# Patient Record
Sex: Female | Born: 1970 | Race: Black or African American | Hispanic: No | Marital: Single | Smoking: Never smoker
Health system: Southern US, Community
[De-identification: ages and names within clinical notes are randomized; demographics above are authoritative.]

## PROBLEM LIST (undated history)

## (undated) DIAGNOSIS — R519 Headache, unspecified: Secondary | ICD-10-CM

## (undated) DIAGNOSIS — R51 Headache: Secondary | ICD-10-CM

## (undated) DIAGNOSIS — R569 Unspecified convulsions: Secondary | ICD-10-CM

## (undated) DIAGNOSIS — E7800 Pure hypercholesterolemia, unspecified: Secondary | ICD-10-CM

## (undated) DIAGNOSIS — E119 Type 2 diabetes mellitus without complications: Secondary | ICD-10-CM

## (undated) DIAGNOSIS — F419 Anxiety disorder, unspecified: Secondary | ICD-10-CM

## (undated) DIAGNOSIS — A749 Chlamydial infection, unspecified: Secondary | ICD-10-CM

## (undated) DIAGNOSIS — F329 Major depressive disorder, single episode, unspecified: Secondary | ICD-10-CM

## (undated) DIAGNOSIS — F32A Depression, unspecified: Secondary | ICD-10-CM

## (undated) DIAGNOSIS — J45909 Unspecified asthma, uncomplicated: Secondary | ICD-10-CM

## (undated) DIAGNOSIS — I639 Cerebral infarction, unspecified: Secondary | ICD-10-CM

## (undated) DIAGNOSIS — I10 Essential (primary) hypertension: Secondary | ICD-10-CM

## (undated) HISTORY — DX: Type 2 diabetes mellitus without complications: E11.9

## (undated) HISTORY — DX: Pure hypercholesterolemia, unspecified: E78.00

## (undated) HISTORY — DX: Unspecified convulsions: R56.9

## (undated) HISTORY — DX: Cerebral infarction, unspecified: I63.9

## (undated) HISTORY — DX: Anxiety disorder, unspecified: F41.9

## (undated) HISTORY — DX: Depression, unspecified: F32.A

## (undated) HISTORY — DX: Major depressive disorder, single episode, unspecified: F32.9

## (undated) HISTORY — DX: Headache, unspecified: R51.9

## (undated) HISTORY — DX: Chlamydial infection, unspecified: A74.9

## (undated) HISTORY — DX: Headache: R51

---

## 1992-09-02 HISTORY — PX: TUBAL LIGATION: SHX77

## 1998-05-29 ENCOUNTER — Emergency Department (HOSPITAL_COMMUNITY): Admission: EM | Admit: 1998-05-29 | Discharge: 1998-05-29 | Payer: Self-pay | Admitting: Emergency Medicine

## 1999-06-22 ENCOUNTER — Other Ambulatory Visit: Admission: RE | Admit: 1999-06-22 | Discharge: 1999-06-22 | Payer: Self-pay | Admitting: Family Medicine

## 1999-08-27 ENCOUNTER — Emergency Department (HOSPITAL_COMMUNITY): Admission: EM | Admit: 1999-08-27 | Discharge: 1999-08-27 | Payer: Self-pay | Admitting: *Deleted

## 1999-08-28 ENCOUNTER — Encounter: Payer: Self-pay | Admitting: *Deleted

## 1999-10-15 ENCOUNTER — Emergency Department (HOSPITAL_COMMUNITY): Admission: EM | Admit: 1999-10-15 | Discharge: 1999-10-16 | Payer: Self-pay | Admitting: Emergency Medicine

## 1999-10-16 ENCOUNTER — Encounter: Payer: Self-pay | Admitting: Emergency Medicine

## 1999-12-04 ENCOUNTER — Ambulatory Visit (HOSPITAL_COMMUNITY): Admission: RE | Admit: 1999-12-04 | Discharge: 1999-12-04 | Payer: Self-pay | Admitting: Occupational Medicine

## 1999-12-04 ENCOUNTER — Encounter: Payer: Self-pay | Admitting: Occupational Medicine

## 1999-12-21 ENCOUNTER — Emergency Department (HOSPITAL_COMMUNITY): Admission: EM | Admit: 1999-12-21 | Discharge: 1999-12-21 | Payer: Self-pay | Admitting: Emergency Medicine

## 1999-12-22 ENCOUNTER — Emergency Department (HOSPITAL_COMMUNITY): Admission: EM | Admit: 1999-12-22 | Discharge: 1999-12-22 | Payer: Self-pay

## 2000-06-04 ENCOUNTER — Emergency Department (HOSPITAL_COMMUNITY): Admission: EM | Admit: 2000-06-04 | Discharge: 2000-06-04 | Payer: Self-pay | Admitting: Emergency Medicine

## 2000-08-06 ENCOUNTER — Emergency Department (HOSPITAL_COMMUNITY): Admission: EM | Admit: 2000-08-06 | Discharge: 2000-08-06 | Payer: Self-pay | Admitting: Emergency Medicine

## 2000-08-14 ENCOUNTER — Ambulatory Visit (HOSPITAL_COMMUNITY): Admission: RE | Admit: 2000-08-14 | Discharge: 2000-08-14 | Payer: Self-pay | Admitting: Specialist

## 2000-08-14 ENCOUNTER — Encounter: Payer: Self-pay | Admitting: Specialist

## 2000-10-22 ENCOUNTER — Emergency Department (HOSPITAL_COMMUNITY): Admission: EM | Admit: 2000-10-22 | Discharge: 2000-10-22 | Payer: Self-pay | Admitting: *Deleted

## 2001-06-17 ENCOUNTER — Encounter: Payer: Self-pay | Admitting: Emergency Medicine

## 2001-06-17 ENCOUNTER — Emergency Department (HOSPITAL_COMMUNITY): Admission: EM | Admit: 2001-06-17 | Discharge: 2001-06-17 | Payer: Self-pay | Admitting: Emergency Medicine

## 2001-06-25 ENCOUNTER — Emergency Department (HOSPITAL_COMMUNITY): Admission: EM | Admit: 2001-06-25 | Discharge: 2001-06-26 | Payer: Self-pay | Admitting: Emergency Medicine

## 2001-06-25 ENCOUNTER — Encounter: Payer: Self-pay | Admitting: Emergency Medicine

## 2001-10-04 ENCOUNTER — Emergency Department (HOSPITAL_COMMUNITY): Admission: EM | Admit: 2001-10-04 | Discharge: 2001-10-04 | Payer: Self-pay | Admitting: *Deleted

## 2001-10-04 ENCOUNTER — Encounter: Payer: Self-pay | Admitting: *Deleted

## 2003-05-21 ENCOUNTER — Emergency Department (HOSPITAL_COMMUNITY): Admission: EM | Admit: 2003-05-21 | Discharge: 2003-05-21 | Payer: Self-pay | Admitting: Emergency Medicine

## 2003-06-03 ENCOUNTER — Other Ambulatory Visit: Admission: RE | Admit: 2003-06-03 | Discharge: 2003-06-03 | Payer: Self-pay | Admitting: Obstetrics and Gynecology

## 2003-09-12 ENCOUNTER — Emergency Department (HOSPITAL_COMMUNITY): Admission: EM | Admit: 2003-09-12 | Discharge: 2003-09-12 | Payer: Self-pay | Admitting: Emergency Medicine

## 2004-05-04 ENCOUNTER — Inpatient Hospital Stay (HOSPITAL_COMMUNITY): Admission: EM | Admit: 2004-05-04 | Discharge: 2004-05-06 | Payer: Self-pay | Admitting: Emergency Medicine

## 2004-06-12 ENCOUNTER — Emergency Department (HOSPITAL_COMMUNITY): Admission: EM | Admit: 2004-06-12 | Discharge: 2004-06-12 | Payer: Self-pay | Admitting: Emergency Medicine

## 2004-07-11 ENCOUNTER — Ambulatory Visit: Payer: Self-pay | Admitting: Psychology

## 2004-08-02 ENCOUNTER — Ambulatory Visit: Payer: Self-pay | Admitting: Psychology

## 2004-11-15 ENCOUNTER — Emergency Department (HOSPITAL_COMMUNITY): Admission: EM | Admit: 2004-11-15 | Discharge: 2004-11-15 | Payer: Self-pay | Admitting: Emergency Medicine

## 2005-09-20 ENCOUNTER — Emergency Department (HOSPITAL_COMMUNITY): Admission: EM | Admit: 2005-09-20 | Discharge: 2005-09-20 | Payer: Self-pay | Admitting: Emergency Medicine

## 2006-01-01 ENCOUNTER — Encounter: Payer: Self-pay | Admitting: Emergency Medicine

## 2006-10-14 ENCOUNTER — Emergency Department (HOSPITAL_COMMUNITY): Admission: EM | Admit: 2006-10-14 | Discharge: 2006-10-14 | Payer: Self-pay | Admitting: Emergency Medicine

## 2007-08-19 ENCOUNTER — Emergency Department (HOSPITAL_COMMUNITY): Admission: EM | Admit: 2007-08-19 | Discharge: 2007-08-19 | Payer: Self-pay | Admitting: Emergency Medicine

## 2007-11-17 ENCOUNTER — Emergency Department (HOSPITAL_COMMUNITY): Admission: EM | Admit: 2007-11-17 | Discharge: 2007-11-17 | Payer: Self-pay | Admitting: Emergency Medicine

## 2009-06-03 ENCOUNTER — Emergency Department: Payer: Self-pay | Admitting: Emergency Medicine

## 2009-12-13 ENCOUNTER — Emergency Department (HOSPITAL_COMMUNITY): Admission: EM | Admit: 2009-12-13 | Discharge: 2009-12-14 | Payer: Self-pay | Admitting: Emergency Medicine

## 2009-12-14 ENCOUNTER — Emergency Department (HOSPITAL_COMMUNITY): Admission: EM | Admit: 2009-12-14 | Discharge: 2009-12-14 | Payer: Self-pay | Admitting: Emergency Medicine

## 2010-11-21 LAB — URINALYSIS, ROUTINE W REFLEX MICROSCOPIC
Glucose, UA: NEGATIVE mg/dL
Hgb urine dipstick: NEGATIVE
Protein, ur: NEGATIVE mg/dL
Specific Gravity, Urine: 1.011 (ref 1.005–1.030)
Urobilinogen, UA: 0.2 mg/dL (ref 0.0–1.0)
pH: 6.5 (ref 5.0–8.0)

## 2011-05-27 LAB — DIFFERENTIAL
Basophils Relative: 0
Eosinophils Absolute: 0.1
Eosinophils Relative: 2
Lymphocytes Relative: 42
Lymphs Abs: 1.9
Neutro Abs: 2.4
Neutrophils Relative %: 51

## 2011-05-27 LAB — I-STAT 8, (EC8 V) (CONVERTED LAB)
Acid-base deficit: 3 — ABNORMAL HIGH
BUN: 9
Bicarbonate: 22.9
HCT: 43
Hemoglobin: 14.6
Sodium: 138
TCO2: 24

## 2011-05-27 LAB — POCT I-STAT CREATININE
Creatinine, Ser: 1
Operator id: 294521

## 2011-05-27 LAB — URINALYSIS, ROUTINE W REFLEX MICROSCOPIC
Bilirubin Urine: NEGATIVE
Hgb urine dipstick: NEGATIVE
Nitrite: NEGATIVE
Specific Gravity, Urine: 1.01
Urobilinogen, UA: 0.2

## 2011-05-27 LAB — CBC
MCV: 82.4
Platelets: 200
RDW: 13.2

## 2011-05-27 LAB — POCT CARDIAC MARKERS: Myoglobin, poc: 69.1

## 2011-10-16 ENCOUNTER — Encounter (HOSPITAL_COMMUNITY): Payer: Self-pay | Admitting: Emergency Medicine

## 2011-10-16 ENCOUNTER — Other Ambulatory Visit: Payer: Self-pay

## 2011-10-16 ENCOUNTER — Emergency Department (HOSPITAL_COMMUNITY)
Admission: EM | Admit: 2011-10-16 | Discharge: 2011-10-16 | Disposition: A | Discharge status: Home / Self Care | Payer: BC Managed Care – PPO | Attending: Emergency Medicine | Admitting: Emergency Medicine

## 2011-10-16 DIAGNOSIS — R209 Unspecified disturbances of skin sensation: Secondary | ICD-10-CM | POA: Insufficient documentation

## 2011-10-16 DIAGNOSIS — F410 Panic disorder [episodic paroxysmal anxiety] without agoraphobia: Secondary | ICD-10-CM

## 2011-10-16 DIAGNOSIS — R4589 Other symptoms and signs involving emotional state: Secondary | ICD-10-CM | POA: Insufficient documentation

## 2011-10-16 NOTE — ED Provider Notes (Signed)
9:41 PM  Date: 10/16/2011  Rate: 72  Rhythm: normal sinus rhythm  QRS Axis: left  Intervals: normal  ST/T Wave abnormalities: normal  Conduction Disutrbances:none  Narrative Interpretation: Essentially normal EKG.  Old EKG Reviewed: unchanged    Carleene Cooper III, MD 10/16/11 2142

## 2011-10-16 NOTE — ED Provider Notes (Signed)
History     CSN: [id number]  Arrival date & time 10/16/11  2037   First MD Initiated Contact with Patient 10/16/11 2215      Chief Complaint  Patient presents with  . Panic Attack    (Consider location/radiation/quality/duration/timing/severity/associated sxs/prior treatment) HPI  41 year old female with history of depression and panic attack is present the ED with chief complaints attack. Patient states she was at home were placed today and was having a conversation with her good friends. While in a conversation her friend asked about patient's daughter, whom she just recently had a verbal argument with. Patient believes talked about her daughter has caused her to have a panic attack. She fell heart palpitations, with tingling sensation throughout body especially around the lips. The symptoms are very similar to prior heart attack. Symptoms lasted for 5-10 minutes. At the urging of her fianc and her coworker she is here in the ED. Patient states the symptom has resolved she is back to her normal baseline. She denies homicidal or suicidal ideation. She denies fever, headache, chest pain, shortness of breath, nausea, vomiting, diarrhea, abdominal pain, back pain, dysuria. She denies any recent recreational drug use. Patient does have antianxiety medication at home, but she does not like to take it.  Patient states she does have a good family support system.  History reviewed. No pertinent past medical history.  History reviewed. No pertinent past surgical history.  No family history on file.  History  Substance Use Topics  . Smoking status: Never Smoker   . Smokeless tobacco: Not on file  . Alcohol Use:     OB History    Grav Para Term Preterm Abortions TAB SAB Ect Mult Living                  Review of Systems  All other systems reviewed and are negative.    Allergies  Review of patient's allergies indicates no known allergies.  Home Medications   Current Outpatient  Rx  Name Route Sig Dispense Refill  . ALKA-SELTZER ANTACID PO Oral Take 2 tablets by mouth daily as needed. Flu symptons    . PHENYLEPHRINE-DM-GG-APAP 5-10-200-325 MG PO TABS Oral Take 1 tablet by mouth daily as needed. Cold/flu symptons      BP 127/84  Pulse 72  Temp 98 F (36.7 C)  Resp 16  Wt 269 lb 13.5 oz (122.4 kg)  SpO2 99%  LMP 10/09/2011  Physical Exam  Nursing note and vitals reviewed. Constitutional: She appears well-developed and well-nourished. No distress.       Awake, alert, nontoxic appearance  HENT:  Head: Atraumatic.  Eyes: Conjunctivae are normal. Right eye exhibits no discharge. Left eye exhibits no discharge.  Neck: Neck supple.  Cardiovascular: Normal rate and regular rhythm.   Pulmonary/Chest: Effort normal. No respiratory distress. She exhibits no tenderness.  Abdominal: Soft. There is no tenderness. There is no rebound.  Musculoskeletal: She exhibits no tenderness.       ROM appears intact, no obvious focal weakness  Neurological:       Mental status and motor strength appears intact  Skin: No rash noted.  Psychiatric: She has a normal mood and affect.    ED Course  Procedures (including critical care time)   Labs Reviewed  GLUCOSE, CAPILLARY   No results found.   1. Panic attack as reaction to stress       MDM  Patient is back to her normal baseline. The symptom is likely another panic  attack. We have obtain EKG that shows no acute finding. Patient requests to be discharged. Patient agrees to follow up with her primary care Dr. for further evaluation. She is currently with normal stable vital signs.  Medical screening examination/treatment/procedure(s) were performed by non-physician practitioner and as supervising physician I was immediately available for consultation/collaboration. Osvaldo Human, M.D.      Fayrene Helper, PA-C 10/16/11 2230  Carleene Cooper III, MD 10/17/11 1048

## 2011-10-16 NOTE — ED Notes (Signed)
Pt alert, nad, presents to ED, c/o panic attack, onset today, states stressful event, instructed to come to ER, denies SI/HI, resp even unlabored, skin pwd

## 2011-10-16 NOTE — Discharge Instructions (Signed)
Anxiety and Panic Attacks Anxiety is your body's way of reacting to real danger or something you think is a danger. It may be fear or worry over a situation like losing your job. Sometimes the cause is not known. A panic attack is made up of physical signs like sweating, shaking, or chest pain. Anxiety and panic attacks may start suddenly. They may be strong. They may come at any time of day, even while sleeping. They may come at any time of life. Panic attacks are scary, but they do not harm you physically.  HOME CARE  Avoid any known causes of your anxiety.   Try to relax. Yoga may help. Tell yourself everything will be okay.   Exercise often.   Get expert advice and help (therapy) to stop anxiety or attacks from happening.   Avoid caffeine, alcohol, and drugs.   Only take medicine as told by your doctor.  GET HELP RIGHT AWAY IF:  Your attacks seem different than normal attacks.   Your problems are getting worse or concern you.  MAKE SURE YOU:  Understand these instructions.   Will watch your condition.   Will get help right away if you are not doing well or get worse.  Document Released: 09/21/2010 Document Revised: 05/01/2011 Document Reviewed: 09/21/2010 [facility] Patient Information 2012 [place], [place].  Stress Management Stress is a state of physical or mental tension that often results from changes in your life or normal routine. Some common causes of stress are:  Death of a loved one.   Injuries or severe illnesses.   Getting fired or changing jobs.   Moving into a new home.  Other causes may be:  Sexual problems.   Business or financial losses.   Taking on a large debt.   Regular conflict with someone at home or at work.   Constant tiredness from lack of sleep.  It is not just bad things that are stressful. It may be stressful to:  Win the lottery.   Get married.   Buy a new car.  The amount of stress that can be easily tolerated varies from person  to person. Changes generally cause stress, regardless of the types of change. Too much stress can affect your health. It may lead to physical or emotional problems. Too little stress (boredom) may also become stressful. SUGGESTIONS TO REDUCE STRESS:  Talk things over with your family and friends. It often is helpful to share your concerns and worries. If you feel your problem is serious, you may want to get help from a professional counselor.   Consider your problems one at a time instead of lumping them all together. Trying to take care of everything at once may seem impossible. List all the things you need to do and then start with the most important one. Set a goal to accomplish 2 or 3 things each day. If you expect to do too many in a single day you will naturally fail, causing you to feel even more stressed.   Do not use alcohol or drugs to relieve stress. Although you may feel better for a short time, they do not remove the problems that caused the stress. They can also be habit forming.   Exercise regularly - at least 3 times per week. Physical exercise can help to relieve that "uptight" feeling and will relax you.   The shortest distance between despair and hope is often a good night's sleep.   Go to bed and get up on time  allowing yourself time for appointments without being rushed.   Take a short "time-out" period from any stressful situation that occurs during the day. Close your eyes and take some deep breaths. Starting with the muscles in your face, tense them, hold it for a few seconds, then relax. Repeat this with the muscles in your neck, shoulders, hand, stomach, back and legs.   Take good care of yourself. Eat a balanced diet and get plenty of rest.   Schedule time for having fun. Take a break from your daily routine to relax.  HOME CARE INSTRUCTIONS   Call if you feel overwhelmed by your problems and feel you can no longer manage them on your own.   Return immediately if  you feel like hurting yourself or someone else.  Document Released: 02/12/2001 Document Revised: 05/01/2011 Document Reviewed: 10/05/2007 [facility] Patient Information 2012 [place], [place].

## 2012-09-02 DIAGNOSIS — I639 Cerebral infarction, unspecified: Secondary | ICD-10-CM

## 2012-09-02 HISTORY — DX: Cerebral infarction, unspecified: I63.9

## 2013-05-28 DIAGNOSIS — E785 Hyperlipidemia, unspecified: Secondary | ICD-10-CM | POA: Insufficient documentation

## 2013-05-31 ENCOUNTER — Other Ambulatory Visit: Payer: Self-pay | Admitting: Family Medicine

## 2013-05-31 DIAGNOSIS — Z1231 Encounter for screening mammogram for malignant neoplasm of breast: Secondary | ICD-10-CM

## 2014-01-17 DIAGNOSIS — I10 Essential (primary) hypertension: Secondary | ICD-10-CM | POA: Insufficient documentation

## 2014-06-01 DIAGNOSIS — I6782 Cerebral ischemia: Secondary | ICD-10-CM | POA: Insufficient documentation

## 2014-06-01 DIAGNOSIS — G939 Disorder of brain, unspecified: Secondary | ICD-10-CM | POA: Insufficient documentation

## 2014-11-10 DIAGNOSIS — E559 Vitamin D deficiency, unspecified: Secondary | ICD-10-CM | POA: Insufficient documentation

## 2015-06-16 DIAGNOSIS — F3341 Major depressive disorder, recurrent, in partial remission: Secondary | ICD-10-CM | POA: Insufficient documentation

## 2015-07-14 DIAGNOSIS — R7303 Prediabetes: Secondary | ICD-10-CM | POA: Insufficient documentation

## 2015-07-14 DIAGNOSIS — IMO0001 Reserved for inherently not codable concepts without codable children: Secondary | ICD-10-CM | POA: Insufficient documentation

## 2015-07-14 DIAGNOSIS — R198 Other specified symptoms and signs involving the digestive system and abdomen: Secondary | ICD-10-CM | POA: Insufficient documentation

## 2015-10-18 DIAGNOSIS — R946 Abnormal results of thyroid function studies: Secondary | ICD-10-CM | POA: Insufficient documentation

## 2016-02-14 ENCOUNTER — Emergency Department (HOSPITAL_COMMUNITY)
Admission: EM | Admit: 2016-02-14 | Discharge: 2016-02-14 | Disposition: A | Discharge status: Home / Self Care | Payer: BC Managed Care – PPO | Attending: Emergency Medicine | Admitting: Emergency Medicine

## 2016-02-14 ENCOUNTER — Emergency Department (HOSPITAL_COMMUNITY): Payer: BC Managed Care – PPO

## 2016-02-14 ENCOUNTER — Encounter (HOSPITAL_COMMUNITY): Payer: Self-pay | Admitting: Emergency Medicine

## 2016-02-14 DIAGNOSIS — R569 Unspecified convulsions: Secondary | ICD-10-CM | POA: Diagnosis present

## 2016-02-14 DIAGNOSIS — Z7982 Long term (current) use of aspirin: Secondary | ICD-10-CM | POA: Diagnosis not present

## 2016-02-14 DIAGNOSIS — I10 Essential (primary) hypertension: Secondary | ICD-10-CM | POA: Diagnosis not present

## 2016-02-14 DIAGNOSIS — Z79899 Other long term (current) drug therapy: Secondary | ICD-10-CM | POA: Diagnosis not present

## 2016-02-14 DIAGNOSIS — J45909 Unspecified asthma, uncomplicated: Secondary | ICD-10-CM | POA: Insufficient documentation

## 2016-02-14 HISTORY — DX: Unspecified asthma, uncomplicated: J45.909

## 2016-02-14 HISTORY — DX: Essential (primary) hypertension: I10

## 2016-02-14 LAB — CBC
HEMATOCRIT: 37.2 % (ref 36.0–46.0)
Hemoglobin: 12.6 g/dL (ref 12.0–15.0)
MCH: 26.4 pg (ref 26.0–34.0)
MCHC: 33.9 g/dL (ref 30.0–36.0)
MCV: 77.8 fL — AB (ref 78.0–100.0)
Platelets: 234 10*3/uL (ref 150–400)
RBC: 4.78 MIL/uL (ref 3.87–5.11)
RDW: 13.6 % (ref 11.5–15.5)
WBC: 8.2 10*3/uL (ref 4.0–10.5)

## 2016-02-14 LAB — I-STAT TROPONIN, ED: Troponin i, poc: 0.01 ng/mL (ref 0.00–0.08)

## 2016-02-14 LAB — BASIC METABOLIC PANEL
Anion gap: 8 (ref 5–15)
BUN: 10 mg/dL (ref 6–20)
CHLORIDE: 102 mmol/L (ref 101–111)
CO2: 26 mmol/L (ref 22–32)
Calcium: 8.8 mg/dL — ABNORMAL LOW (ref 8.9–10.3)
Creatinine, Ser: 0.72 mg/dL (ref 0.44–1.00)
GFR calc Af Amer: >60 mL/min (ref >60)
GFR calc non Af Amer: >60 mL/min (ref >60)
GLUCOSE: 108 mg/dL — AB (ref 65–99)
POTASSIUM: 3 mmol/L — AB (ref 3.5–5.1)
Sodium: 136 mmol/L (ref 135–145)

## 2016-02-14 MED ORDER — ACETAMINOPHEN 500 MG PO TABS
1000.0000 mg | ORAL_TABLET | Freq: Once | ORAL | Status: AC
  Administered 2016-02-14: 1000 mg via ORAL
  Filled 2016-02-14: qty 2

## 2016-02-14 NOTE — ED Notes (Signed)
Pt states that her fiance told her that last night, she was convulsing, and choking.  No hx of seizures.  States that she has been having headaches.  States that she bit her tongue in her sleep a few weeks  ago. And thinks she might have had a seizure then.

## 2016-02-14 NOTE — ED Notes (Signed)
Now complaining of chest pain radiating down rt arm.  Will get EKG.

## 2016-02-14 NOTE — ED Provider Notes (Signed)
CSN: [id number]     Arrival date & time 02/14/16  1159 History   First MD Initiated Contact with Patient 02/14/16 1424     Chief Complaint  Patient presents with  . Seizures     (Consider location/radiation/quality/duration/timing/severity/associated sxs/prior Treatment) Patient is a 45 y.o. female presenting with seizures. The history is provided by the patient.  Seizures Seizure activity on arrival: yes   Seizure type:  Grand mal Preceding symptoms comment:  Patient was asleep Initial focality:  None Episode characteristics: generalized shaking, tongue biting and unresponsiveness   Postictal symptoms: confusion, memory loss and somnolence   Return to baseline: yes   Severity:  Moderate Duration:  30 seconds Timing:  Once Number of seizures this episode:  1 Progression:  Unchanged Context: not alcohol withdrawal and not fever   Recent head injury:  No recent head injuries PTA treatment:  None History of seizures: no     Past Medical History  Diagnosis Date  . Asthma   . Hypertension    No past surgical history on file. No family history on file. Social History  Substance Use Topics  . Smoking status: Never Smoker   . Smokeless tobacco: None  . Alcohol Use: No   OB History    No data available     Review of Systems  Neurological: Positive for seizures.  All other systems reviewed and are negative.     Allergies  Review of patient's allergies indicates no known allergies.  Home Medications   Prior to Admission medications   Medication Sig Start Date End Date Taking? Authorizing Provider  Calcium Carbonate Antacid (ALKA-SELTZER ANTACID PO) Take 2 tablets by mouth daily as needed. Flu symptons    Historical Provider, MD  Phenylephrine-DM-GG-APAP (TYLENOL COLD/FLU SEVERE) 5-10-200-325 MG TABS Take 1 tablet by mouth daily as needed. Cold/flu symptons    Historical Provider, MD   BP 114/69 mmHg  Pulse 73  Temp(Src) 99 F (37.2 C) (Oral)  Resp 16  Ht 5'  5" (1.651 m)  Wt 262 lb (118.842 kg)  BMI 43.60 kg/m2  SpO2 93%  LMP 02/07/2016 Physical Exam  Constitutional: She is oriented to person, place, and time. She appears well-developed and well-nourished. No distress.  HENT:  Head: Normocephalic.  Eyes: Conjunctivae are normal.  Neck: Neck supple. No tracheal deviation present.  Cardiovascular: Normal rate, regular rhythm and normal heart sounds.   Pulmonary/Chest: Effort normal. No respiratory distress.  Abdominal: Soft. She exhibits no distension.  Neurological: She is alert and oriented to person, place, and time. She has normal strength. No cranial nerve deficit or sensory deficit. Coordination normal. GCS eye subscore is 4. GCS verbal subscore is 5. GCS motor subscore is 6.  Normal finger to nose testing and rapid alternating movement   Skin: Skin is warm and dry.  Psychiatric: She has a normal mood and affect.  Vitals reviewed.   ED Course  Procedures (including critical care time) Labs Review Labs Reviewed  BASIC METABOLIC PANEL - Abnormal; Notable for the following:    Potassium 3.0 (*)    Glucose, Bld 108 (*)    Calcium 8.8 (*)    All other components within normal limits  CBC - Abnormal; Notable for the following:    MCV 77.8 (*)    All other components within normal limits  Rosezena SensorI-STAT TROPOININ, ED    Imaging Review Dg Chest 2 View  02/14/2016  CLINICAL DATA:  Query nocturnal seizures. EXAM: CHEST  2 VIEW COMPARISON:  None. FINDINGS:  AP semi-erect radiograph demonstrates borderline cardiac enlargement. Low lung volumes accentuate the apparent heart size. No infiltrates or failure. No effusion or pneumothorax. No osseous findings. Mild degenerative scoliosis convex RIGHT. IMPRESSION: Borderline cardiac enlargement, stable from priors. No active infiltrates or failure. Electronically Signed   By: Elsie Stain M.D.   On: 02/14/2016 14:00   Ct Head Wo Contrast  02/14/2016  CLINICAL DATA:  Seizure 1 day prior.  Headache  following seizure EXAM: CT HEAD WITHOUT CONTRAST TECHNIQUE: Contiguous axial images were obtained from the base of the skull through the vertex without intravenous contrast. COMPARISON:  November 17, 2007 FINDINGS: The ventricles are normal in size and configuration. There is invagination of CSF into the sella, consistent with a degree of empty sella. There is no intracranial mass, hemorrhage, extra-axial fluid collection, or midline shift. Gray-white compartments are normal. No acute infarct evident. The bony calvarium appears intact. The mastoid air cells are clear. Orbits appear symmetric and unremarkable bilaterally. 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 The ventricles are normal in size and configuration. There is no intracranial mass, hemorrhage, or focal gray -white compartment lesion. Electronically Signed   By: Bretta Bang III M.D.   On: 02/14/2016 16:07   I have personally reviewed and evaluated these images and lab results as part of my medical decision-making.   EKG Interpretation   Date/Time:  Wednesday February 14 2016 13:27:42 EDT Ventricular Rate:  73 PR Interval:  142 QRS Duration: 86 QT Interval:  434 QTC Calculation: 478 R Axis:   -37 Text Interpretation:  Sinus rhythm Left axis deviation Abnormal R-wave  progression, late transition No significant change since last tracing  Confirmed by Peyton Spengler MD, Reuel Boom ([id number]) on 02/14/2016 2:25:46 PM      MDM   Final diagnoses:  Convulsions, unspecified convulsion type (HCC)   45 y.o. female presents with single possible seizure episode while she was in bed last night and she was told by s/o that she was shaking and convulsing but stopped and had a staring spell. Minor tongue and lip abrasions from possible event, not incontinent during episode. No recurrence. Never had a seizure before, unclear if this represents a seizure episode and was not witnessed by [employer]. Workup negative for emergent etiology and not at risk for  status. Return precautions discussed and routine OP f/u with neurology to establish care and complete workup.     Lyndal Pulley, MD 02/14/16 [id number]

## 2016-02-14 NOTE — Discharge Instructions (Signed)

## 2016-02-19 ENCOUNTER — Ambulatory Visit (INDEPENDENT_AMBULATORY_CARE_PROVIDER_SITE_OTHER): Payer: BC Managed Care – PPO | Admitting: Neurology

## 2016-02-19 ENCOUNTER — Encounter: Payer: Self-pay | Admitting: Neurology

## 2016-02-19 VITALS — BP 110/68 | HR 68 | Ht 65.0 in | Wt 279.2 lb

## 2016-02-19 DIAGNOSIS — R569 Unspecified convulsions: Secondary | ICD-10-CM | POA: Diagnosis not present

## 2016-02-19 NOTE — Patient Instructions (Addendum)
Remember to drink plenty of fluid, eat healthy meals and do not skip any meals. Try to eat protein with a every meal and eat a healthy snack such as fruit or nuts in between meals. Try to keep a regular sleep-wake schedule and try to exercise daily, particularly in the form of walking, 20-30 minutes a day, if you can.   As far as diagnostic testing: MRI brain, EEG in the office then if normal recommend a 2-3 day eeg at home  I would like to see you back in 3 months, sooner if we need to. Please call us with any interim questions, concerns, problems, updates or refill requests.   Our phone number is [phone]. We also have an after hours call service for urgent matters and there is a physician on-call for urgent questions. For any emergencies you know to call 911 or go to the nearest emergency room  Patient is unable to drive, operate heavy machinery, perform activities at heights or participate in water activities until 6 months seizure free

## 2016-02-19 NOTE — Progress Notes (Addendum)
[facility]    Provider:  Dr Lucia GaskinsAhern Referring Provider: emergency room Primary Care Physician:  Delbert HarnessBRISCOE, KIM, MD  CC: seizure  HPI:  Kirsten Huang is a 45 y.o. female here as a referral from the emergency room for seizure. She has a past medical history of asthma, hypertension, diabetes, anxiety, depression, high cholesterol. She woke up on the 14th of this month and her fiance was very worried, in her face, he mouth hurt very bad, he said she was convulsing, she bit her tongue in 4 different spots. Her tongue was cut open. She was drooling. She would not wake up, she was very tired. She remembers the emergency room. She denies any other associated symptoms. No Hx of seizures or FHx of seizures. She was attacked by a swarm of bees a week previous otherwise no inciting events or previous illnesses or head trauma. .A few months ago she woke up with a bitten tongue as well. Never had alteration of consciousness, or any other strange episodes in the past. Never urinated on herself. The episode lasted about 30 seconds. She denied any alcohol or drug use.denied any other focal neurologic deficits or symptoms.  Reviewed notes, labs and imaging from outside physicians, which showed:  CT of the head (personally reviewed and agree with the following): The ventricles are normal in size and configuration. There is invagination of CSF into the sella, consistent with a degree of empty sella. There is no intracranial mass, hemorrhage, extra-axial fluid collection, or midline shift. Gray-white compartments are normal. No acute infarct evident. The bony calvarium appears intact. The mastoid air cells are clear. Orbits appear symmetric and unremarkable bilaterally.  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 The ventricles are normal in size and configuration. There is no intracranial mass, hemorrhage, or focal gray -white compartment lesion.  BMP in the hospital was abnormal with  potassium of 3,CBC was essentially normal  Reviewed emergency room notes.Patient presented on the 14th of this month to the ED after fianc told her that she was convulsing and choking. Patient had been having headaches and her tongue was bitten that evening as well as a few weeks ago. She also complained of chest pain radiating down the right arm. EKG Was stable as compared since last tracing. They reported generalized shaking and tongue biting and unresponsiveness with postictal symptoms of confusion and memory loss and somnolence. The duration was 30 seconds. No alcohol. No recent head injuries.. CT of the head was completed.Vitals were normal. Exam in the emergency room was normal, no mention of her tongue was made in exam. She was diagnosed with a seizure. Also reported as staring spell afterwards. They did note minor tongue and lip abrasions not incontinent during episodes. Workup was negative and patient was discharged with outpatient neurology follow-up.  In 2013 patient reported to the emergency room complaining of a panic attack after a stressful event    Review of Systems: Patient complains of symptoms per HPI as well as the following symptoms: joint pain, allergies. Pertinent negatives per HPI. All others negative.   Social History   Social History  . Marital Status: Single    Spouse Name: N/A  . Number of Children: 4  . Years of Education: 16   Occupational History  . [employer]    Social History Main Topics  . Smoking status: Never Smoker   . Smokeless tobacco: Not on file  . Alcohol Use: No  . Drug Use: Not on file  .  Sexual Activity: Not on file   Other Topics Concern  . Not on file   Social History Narrative   Lives with boyfriend   Caffeine use: soda daily       Family History  Problem Relation Age of Onset  . Cancer    . Diabetes    . Seizures Neg Hx     Past Medical History  Diagnosis Date  . Asthma   . Hypertension   . Diabetes (HCC)   .  Anxiety   . Depression   . High cholesterol   . Stroke (cerebrum) (HCC) 2014  . Headache     Past Surgical History  Procedure Laterality Date  . Tympanostomy tube placement      Current Outpatient Prescriptions  Medication Sig Dispense Refill  . Apple Cider Vinegar 500 MG TABS Take 1 tablet by mouth daily.    Marland Kitchen aspirin 325 MG EC tablet Take 325 mg by mouth daily.    . Aspirin-Salicylamide-Caffeine (BC HEADACHE PO) Take 1 Package by mouth daily as needed (pain).    . Calcium Carbonate Antacid (ALKA-SELTZER ANTACID PO) Take 2 tablets by mouth daily as needed. Flu symptons    . celecoxib (CELEBREX) 200 MG capsule Take 200 mg by mouth daily.     . hydrochlorothiazide (HYDRODIURIL) 25 MG tablet Take 25 mg by mouth daily.     . sertraline (ZOLOFT) 100 MG tablet Take 100 mg by mouth daily.      No current facility-administered medications for this visit.    Allergies as of 02/19/2016  . (No Known Allergies)    Vitals: BP 110/68 mmHg  Pulse 68  Ht  (1.651 m)  Wt 279 lb 3.2 oz (126.644 kg)  BMI 46.46 kg/m2  LMP 02/07/2016 Last Weight:  Wt Readings from Last 1 Encounters:  02/19/16 279 lb 3.2 oz (126.644 kg)   Last Height:   Ht Readings from Last 1 Encounters:  02/19/16  (1.651 m)    Physical exam: Exam: Gen: NAD, conversant, well nourised, obese, well groomed                     CV: RRR, no MRG. No Carotid Bruits. No peripheral edema, warm, nontender Eyes: Conjunctivae clear without exudates or hemorrhage  Neuro: Detailed Neurologic Exam  Speech:    Speech is normal; fluent and spontaneous with normal comprehension.  Cognition:    The patient is oriented to person, place, and time;     recent and remote memory intact;     language fluent;     normal attention, concentration,     fund of knowledge Cranial Nerves:    The pupils are equal, round, and reactive to light. The fundi are normal and spontaneous venous pulsations are present. Visual fields are  full to finger confrontation. Extraocular movements are intact. Trigeminal sensation is intact and the muscles of mastication are normal. The face is symmetric. The palate elevates in the midline. Hearing intact. Voice is normal. Shoulder shrug is normal. The tongue has normal motion without fasciculations.   Coordination:    Normal finger to nose and heel to shin. Normal rapid alternating movements.   Gait:    Heel-toe and tandem gait are normal.   Motor Observation:    No asymmetry, no atrophy, and no involuntary movements noted. Tone:    Normal muscle tone.    Posture:    Posture is normal. normal erect    Strength:    Strength is  V/V in the upper and lower limbs.      Sensation: intact to LT     Reflex Exam:  DTR's:    Deep tendon reflexes in the upper and lower extremities are normal bilaterally.   Toes:    The toes are downgoing bilaterally.   Clonus:    Clonus is absent.      Assessment/Plan:  45 year old with what sounds like a GTCS in the middle of the night, a month earlier bit tongue at night possible previous seizure.. I highly recommend AEDs but patient declines at this point. Ordered MRI of the brain w/wo contrast. EEG routine ordered, if  One hour.EEG negative recommend 48-72 hour eeg. Again, patient declines antiepileptics medications despite my urging; I emphasized that this does sound like she has had one or possibly 2 seizures.She would like to have the work up first and then discuss. Patient declined starting AEDs.  Patient is unable to drive, operate heavy machinery, perform activities at heights or participate in water activities until 6 months seizure free  Discussed Patients with epilepsy have a small risk of sudden unexpected death, a condition referred to as sudden unexpected death in epilepsy (SUDEP). SUDEP is defined specifically as the sudden, unexpected, witnessed or unwitnessed, nontraumatic and nondrowning death in patients with epilepsy with or  without evidence for a seizure, and excluding documented status epilepticus, in which post mortem examination does not reveal a structural or toxicologic cause for death      Naomie Dean, MD  [facility] [address] [place], [place] [address]  Phone [phone] Fax [phone]

## 2016-02-20 ENCOUNTER — Encounter: Payer: Self-pay | Admitting: Neurology

## 2016-02-20 DIAGNOSIS — R569 Unspecified convulsions: Secondary | ICD-10-CM | POA: Insufficient documentation

## 2016-02-22 DIAGNOSIS — R569 Unspecified convulsions: Secondary | ICD-10-CM | POA: Insufficient documentation

## 2016-02-25 ENCOUNTER — Emergency Department (HOSPITAL_COMMUNITY)
Admission: EM | Admit: 2016-02-25 | Discharge: 2016-02-25 | Disposition: A | Discharge status: Home / Self Care | Payer: BC Managed Care – PPO | Attending: Emergency Medicine | Admitting: Emergency Medicine

## 2016-02-25 ENCOUNTER — Other Ambulatory Visit: Payer: Self-pay

## 2016-02-25 ENCOUNTER — Encounter (HOSPITAL_COMMUNITY): Payer: Self-pay | Admitting: *Deleted

## 2016-02-25 DIAGNOSIS — R410 Disorientation, unspecified: Secondary | ICD-10-CM | POA: Diagnosis present

## 2016-02-25 DIAGNOSIS — J45909 Unspecified asthma, uncomplicated: Secondary | ICD-10-CM | POA: Insufficient documentation

## 2016-02-25 DIAGNOSIS — Z8673 Personal history of transient ischemic attack (TIA), and cerebral infarction without residual deficits: Secondary | ICD-10-CM | POA: Insufficient documentation

## 2016-02-25 DIAGNOSIS — Z7982 Long term (current) use of aspirin: Secondary | ICD-10-CM | POA: Insufficient documentation

## 2016-02-25 DIAGNOSIS — Z79899 Other long term (current) drug therapy: Secondary | ICD-10-CM | POA: Insufficient documentation

## 2016-02-25 DIAGNOSIS — R404 Transient alteration of awareness: Secondary | ICD-10-CM | POA: Diagnosis not present

## 2016-02-25 DIAGNOSIS — F329 Major depressive disorder, single episode, unspecified: Secondary | ICD-10-CM | POA: Insufficient documentation

## 2016-02-25 DIAGNOSIS — I10 Essential (primary) hypertension: Secondary | ICD-10-CM | POA: Diagnosis not present

## 2016-02-25 LAB — CBC WITH DIFFERENTIAL/PLATELET
Basophils Absolute: 0 10*3/uL (ref 0.0–0.1)
Basophils Relative: 0 %
EOS ABS: 0.1 10*3/uL (ref 0.0–0.7)
EOS PCT: 2 %
HCT: 39 % (ref 36.0–46.0)
HEMOGLOBIN: 13.1 g/dL (ref 12.0–15.0)
LYMPHS ABS: 2.8 10*3/uL (ref 0.7–4.0)
LYMPHS PCT: 39 %
MCH: 27.2 pg (ref 26.0–34.0)
MCHC: 33.6 g/dL (ref 30.0–36.0)
MCV: 81.1 fL (ref 78.0–100.0)
MONOS PCT: 6 %
Monocytes Absolute: 0.4 10*3/uL (ref 0.1–1.0)
Neutro Abs: 3.9 10*3/uL (ref 1.7–7.7)
Neutrophils Relative %: 53 %
PLATELETS: 240 10*3/uL (ref 150–400)
RBC: 4.81 MIL/uL (ref 3.87–5.11)
RDW: 13.8 % (ref 11.5–15.5)
WBC: 7.2 10*3/uL (ref 4.0–10.5)

## 2016-02-25 LAB — RAPID URINE DRUG SCREEN, HOSP PERFORMED
AMPHETAMINES: NOT DETECTED
BENZODIAZEPINES: NOT DETECTED
Barbiturates: NOT DETECTED
COCAINE: NOT DETECTED
OPIATES: POSITIVE — AB
TETRAHYDROCANNABINOL: NOT DETECTED

## 2016-02-25 LAB — URINALYSIS, ROUTINE W REFLEX MICROSCOPIC
Bilirubin Urine: NEGATIVE
Glucose, UA: NEGATIVE mg/dL
Hgb urine dipstick: NEGATIVE
Ketones, ur: NEGATIVE mg/dL
NITRITE: NEGATIVE
PROTEIN: NEGATIVE mg/dL
SPECIFIC GRAVITY, URINE: 1.028 (ref 1.005–1.030)
pH: 5.5 (ref 5.0–8.0)

## 2016-02-25 LAB — COMPREHENSIVE METABOLIC PANEL
ALK PHOS: 47 U/L (ref 38–126)
ALT: 20 U/L (ref 14–54)
ANION GAP: 6 (ref 5–15)
AST: 20 U/L (ref 15–41)
Albumin: 4.1 g/dL (ref 3.5–5.0)
BUN: 15 mg/dL (ref 6–20)
CALCIUM: 9.2 mg/dL (ref 8.9–10.3)
CO2: 28 mmol/L (ref 22–32)
CREATININE: 0.9 mg/dL (ref 0.44–1.00)
Chloride: 103 mmol/L (ref 101–111)
Glucose, Bld: 106 mg/dL — ABNORMAL HIGH (ref 65–99)
Potassium: 3.4 mmol/L — ABNORMAL LOW (ref 3.5–5.1)
SODIUM: 137 mmol/L (ref 135–145)
Total Bilirubin: 0.3 mg/dL (ref 0.3–1.2)
Total Protein: 8 g/dL (ref 6.5–8.1)

## 2016-02-25 LAB — URINE MICROSCOPIC-ADD ON

## 2016-02-25 LAB — CBG MONITORING, ED: Glucose-Capillary: 104 mg/dL — ABNORMAL HIGH (ref 65–99)

## 2016-02-25 MED ORDER — LEVETIRACETAM 500 MG PO TABS: 500.0000 mg | ORAL_TABLET | Freq: Two times a day (BID) | ORAL | Status: DC

## 2016-02-25 NOTE — ED Notes (Addendum)
Pt states her daughter noticed she has been "talking crazy" since yesterday evening around 6PM. Pt states her daughter said she was saying things that did not make sense yesterday, although the patient thought she was speaking normally. Pt states she had similar symptoms 3 years ago and was told she had a mild stroke. Pt went to work today and was told to get checked out when pt seemed more quiet than usual.   Pt daughter states the pt also had slurred speech at the time, which she states has resolved. Pt states she had seizure 2 weeks ago and was seen by neurologist.

## 2016-02-25 NOTE — ED Provider Notes (Signed)
CSN: [id number]     Arrival date & time 02/25/16  1344 History   First MD Initiated Contact with Patient 02/25/16 1533     Chief Complaint  Patient presents with  . Aphasia     (Consider location/radiation/quality/duration/timing/severity/associated sxs/prior Treatment) Patient is a 45 y.o. female presenting with altered mental status. The history is provided by the patient.  Altered Mental Status Presenting symptoms: behavior changes, confusion and memory loss   Severity:  Moderate Most recent episode:  Yesterday Episode history:  Single Timing:  Sporadic Progression:  Resolved Chronicity:  New Context comment:  Possible recent seizure episode Associated symptoms: headaches (yesterday, resolved)   Associated symptoms: no abdominal pain, no decreased appetite, no fever and no vomiting     Past Medical History  Diagnosis Date  . Asthma   . Hypertension   . Diabetes (HCC)   . Anxiety   . Depression   . High cholesterol   . Stroke (cerebrum) (HCC) 2014  . Headache    Past Surgical History  Procedure Laterality Date  . Tympanostomy tube placement     Family History  Problem Relation Age of Onset  . Cancer    . Diabetes    . Seizures Neg Hx    Social History  Substance Use Topics  . Smoking status: Never Smoker   . Smokeless tobacco: None  . Alcohol Use: No   OB History    No data available     Review of Systems  Constitutional: Negative for fever and decreased appetite.  Gastrointestinal: Negative for vomiting and abdominal pain.  Neurological: Positive for headaches (yesterday, resolved).  Psychiatric/Behavioral: Positive for memory loss and confusion.  All other systems reviewed and are negative.     Allergies  Review of patient's allergies indicates no known allergies.  Home Medications   Prior to Admission medications   Medication Sig Start Date End Date Taking? Authorizing Provider  Apple Cider Vinegar 500 MG TABS Take 1 tablet by mouth daily.    Yes Historical Provider, MD  aspirin 325 MG EC tablet Take 325 mg by mouth daily.   Yes Historical Provider, MD  Aspirin-Salicylamide-Caffeine (BC HEADACHE PO) Take 1 Package by mouth daily as needed (pain).   Yes Historical Provider, MD  celecoxib (CELEBREX) 200 MG capsule Take 200 mg by mouth daily.  01/30/16  Yes Historical Provider, MD  cetirizine (ZYRTEC) 10 MG tablet Take 10 mg by mouth daily as needed for allergies.   Yes Historical Provider, MD  hydrochlorothiazide (HYDRODIURIL) 25 MG tablet Take 25 mg by mouth daily.  12/01/15  Yes Historical Provider, MD  sertraline (ZOLOFT) 100 MG tablet Take 100 mg by mouth daily.  01/30/16  Yes Historical Provider, MD   BP 135/79 mmHg  Pulse 74  Temp(Src) 100.2 F (37.9 C) (Oral)  Resp 18  SpO2 98%  LMP 02/07/2016 Physical Exam  Constitutional: She is oriented to person, place, and time. She appears well-developed and well-nourished. No distress.  HENT:  Head: Normocephalic.  Eyes: Conjunctivae are normal.  Neck: Neck supple. No tracheal deviation present.  Cardiovascular: Normal rate, regular rhythm and normal heart sounds.   Pulmonary/Chest: Effort normal and breath sounds normal. No respiratory distress.  Abdominal: Soft. She exhibits no distension.  Neurological: She is alert and oriented to person, place, and time. No cranial nerve deficit. Coordination normal.  Skin: Skin is warm and dry.  Psychiatric: She has a normal mood and affect. Her speech is normal and behavior is normal. Thought content normal.  Vitals reviewed.   ED Course  Procedures (including critical care time) Labs Review Labs Reviewed  URINALYSIS, ROUTINE W REFLEX MICROSCOPIC (NOT AT [facility]) - Abnormal; Notable for the following:    APPearance CLOUDY (*)    Leukocytes, UA SMALL (*)    All other components within normal limits  URINE RAPID DRUG SCREEN, HOSP PERFORMED - Abnormal; Notable for the following:    Opiates POSITIVE (*)    All other components within  normal limits  COMPREHENSIVE METABOLIC PANEL - Abnormal; Notable for the following:    Potassium 3.4 (*)    Glucose, Bld 106 (*)    All other components within normal limits  URINE MICROSCOPIC-ADD ON - Abnormal; Notable for the following:    Squamous Epithelial / LPF 6-30 (*)    Bacteria, UA FEW (*)    All other components within normal limits  CBG MONITORING, ED - Abnormal; Notable for the following:    Glucose-Capillary 104 (*)    All other components within normal limits  CBC WITH DIFFERENTIAL/PLATELET    Imaging Review No results found. I have personally reviewed and evaluated these images and lab results as part of my medical decision-making.   EKG Interpretation None      MDM   Final diagnoses:  Transient alteration of awareness    45 y.o. female presents with episode yesterday where she was trying to interact with people at the store after driving there while attempting to buy a lottery ticket but was having difficulty communicating and she had some amnesia during the event. Has h/o TIA but negative neuroimaging recently. She was reminded later by her daughter who was on the phone and said she sounded confused but she now can recall becoming frustrated during the event. She has seen neurology regarding possible seizure episodes recently and was recommended AEDs but declined until her workup was completed. I discussed this event with Dr Amada JupiterKirkpatrick of neurology who is o/c and he agreed with continued OP workup since the Pt has completely resolved and we discussed starting keppra as previously recommended by evaluating neurologist. We both agree imaging is not indicated emergently as Pt has completely resolved.  Today her UDS is positive for opiates and on review of database she has not had a prescription for opiates since receiving norco x30 in 11/2015. This may be potentially contributing to symptoms but Pt was not confronted about this on today's visit as it had not resulted at  time of discharge. Plan will be for neurology f/u with OP MR and  return precautions discussed for worsening symptoms or recurrence. Pt instructed she not to drive until she is cleared by a neurologist.     Lyndal Pulleyaniel Jonie Burdell, MD 02/26/16 [phone]

## 2016-02-25 NOTE — Discharge Instructions (Signed)
Confusion Confusion is the inability to think with your usual speed or clarity. Confusion may come on quickly or slowly over time. How quickly the confusion comes on depends on the cause. Confusion can be due to any number of causes. CAUSES   Concussion, head injury, or head trauma.  Seizures.  Stroke.  Fever.  Brain tumor.  Age related decreased brain function (dementia).  Heightened emotional states like rage or terror.  Mental illness in which the person loses the ability to determine what is real and what is not (hallucinations).  Infections such as a urinary tract infection (UTI).  Toxic effects from alcohol, drugs, or prescription medicines.  Dehydration and an imbalance of salts in the body (electrolytes).  Lack of sleep.  Low blood sugar (diabetes).  Low levels of oxygen from conditions such as chronic lung disorders.  Drug interactions or other medicine side effects.  Nutritional deficiencies, especially niacin, thiamine, vitamin C, or vitamin B.  Sudden drop in body temperature (hypothermia).  Change in routine, such as when traveling or hospitalized. SIGNS AND SYMPTOMS  People often describe their thinking as cloudy or unclear when they are confused. Confusion can also include feeling disoriented. That means you are unaware of where or who you are. You may also not know what the date or time is. If confused, you may also have difficulty paying attention, remembering, and making decisions. Some people also act aggressively when they are confused.  DIAGNOSIS  The medical evaluation of confusion may include:  Blood and urine tests.  X-rays.  Brain and nervous system tests.  Analyzing your brain waves (electroencephalogram or EEG).  Magnetic resonance imaging (MRI) of your head.  Computed tomography (CT) scan of your head.  Mental status tests in which your health care provider may ask many questions. Some of these questions may seem silly or strange,  but they are a very important test to help diagnose and treat confusion. TREATMENT  An admission to the hospital may not be needed, but a person with confusion should not be left alone. Stay with a family member or friend until the confusion clears. Avoid alcohol, pain relievers, or sedative drugs until you have fully recovered. Do not drive until directed by your health care provider. HOME CARE INSTRUCTIONS  What family and friends can do:  To find out if someone is confused, ask the person to state his or her name, age, and the date. If the person is unsure or answers incorrectly, he or she is confused.  Always introduce yourself, no matter how well the person knows you.  Often remind the person of his or her location.  Place a calendar and clock near the confused person.  Help the person with his or her medicines. You may want to use a pill box, an alarm as a reminder, or give the person each dose as prescribed.  Talk about current events and plans for the day.  Try to keep the environment calm, quiet, and peaceful.  Make sure the person keeps follow-up visits with his or her health care provider. PREVENTION  Ways to prevent confusion:  Avoid alcohol.  Eat a balanced diet.  Get enough sleep.  Take medicine only as directed by your health care provider.  Do not become isolated. Spend time with other people and make plans for your days.  Keep careful watch on your blood sugar levels if you are diabetic. SEEK IMMEDIATE MEDICAL CARE IF:   You develop severe headaches, repeated vomiting, seizures, blackouts, or   slurred speech.  There is increasing confusion, weakness, numbness, restlessness, or personality changes.  You develop a loss of balance, have marked dizziness, feel uncoordinated, or fall.  You have delusions, hallucinations, or develop severe anxiety.  Your family members think you need to be rechecked.   This information is not intended to replace advice given  to you by your health care provider. Make sure you discuss any questions you have with your health care provider.   Document Released: 09/26/2004 Document Revised: 09/09/2014 Document Reviewed: 09/24/2013 Elsevier Interactive Patient Education 2016 Elsevier Inc.  

## 2016-02-25 NOTE — ED Notes (Signed)
Pt states she has been experiencing episodes with altered mental status. Pt states that she has not been taking any of her home medications including her blood pressure medication. Pt has also been fatigued at work

## 2016-02-27 DIAGNOSIS — M549 Dorsalgia, unspecified: Secondary | ICD-10-CM | POA: Insufficient documentation

## 2016-02-27 DIAGNOSIS — J45909 Unspecified asthma, uncomplicated: Secondary | ICD-10-CM | POA: Insufficient documentation

## 2016-02-28 ENCOUNTER — Ambulatory Visit: Payer: BC Managed Care – PPO | Admitting: Neurology

## 2016-03-13 ENCOUNTER — Telehealth: Payer: Self-pay | Admitting: Neurology

## 2016-03-13 ENCOUNTER — Ambulatory Visit (INDEPENDENT_AMBULATORY_CARE_PROVIDER_SITE_OTHER): Payer: BC Managed Care – PPO | Admitting: Neurology

## 2016-03-13 DIAGNOSIS — R569 Unspecified convulsions: Secondary | ICD-10-CM

## 2016-03-13 NOTE — Telephone Encounter (Signed)
I have called patient for abnormal EEG which showed evidence of generalized epileptiform discharges, she presented to the emergency room in June 20 fifth 2017 for episode of sudden onset confusion, difficulty talking, she was started on Keppra 500 mg twice a day, had a recurrent episode of dizziness, falling to the ground on March 12 2016, patient contributed to the side effect of Keppra, she is only taking 1 tablet a day  1, I have advised her taking Keppra 500 mg half tablet in the morning, one and half tablet in the evening,= 1000 mg day, 2, no driving until episode free for 6 months 3. Please give her a follow-up appointment with Dr. Lucia GaskinsAhern to further discuss antiepileptic medication choice.

## 2016-03-13 NOTE — Procedures (Signed)
   HISTORY:  45 years old female, had seizure in her sleep on June 14th 2017  TECHNIQUE:  16 channel EEG was performed based on standard 10-16 international system. One channel was dedicated to EKG, which has demonstrates normal sinus rhythm of 72 beats per minutes.  Upon awakening, the posterior background activity was well-developed, in alpha range, 9 Hz, , reactive to eye opening and closure.  Photic stimulation was performed, which induced a symmetric photic driving.  Hyperventilation was performed twice, at the end of hyperventilation, there was short burst of frontal predominant, generalized high amplitude 3 Hz spike-slow epileptiform discharges, lasting 2-3 seconds. There was intermittent similar epileptiform discharges at post hyperventilation period of time.   CONCLUSION: This is an abnormal awake EEG.  There is electrodiagnostic evidence of generalized epileptiform discharge, especially during hyperventilation and shortly following hyperventilation, consistent with generalized epilepsy disorder.  Levert FeinsteinYijun Mallie Linnemann, M.D. Ph.D.  [facility] [address] [place], [place] [address] Phone: [phone] Fax:      [phone]

## 2016-03-14 NOTE — Telephone Encounter (Signed)
Called Kirsten Huang to schedule f/u per Dr Terrace ArabiaYan request. Dr Lucia GaskinsAhern stated I could place Kirsten Huang on 7/28 at 8am. Kirsten Huang declined and states she is going out of town that week. She is leaving on the 7/27.  I made f/u for 03/27/16 at 430pm. Ok per Dr Lucia GaskinsAhern to schedule in this slot. Advised Kirsten Huang to check in at 415pm. She verbalized understanding.

## 2016-03-27 ENCOUNTER — Encounter: Payer: Self-pay | Admitting: Neurology

## 2016-03-27 ENCOUNTER — Ambulatory Visit (INDEPENDENT_AMBULATORY_CARE_PROVIDER_SITE_OTHER): Payer: BC Managed Care – PPO | Admitting: Neurology

## 2016-03-27 DIAGNOSIS — G40309 Generalized idiopathic epilepsy and epileptic syndromes, not intractable, without status epilepticus: Secondary | ICD-10-CM

## 2016-03-27 NOTE — Patient Instructions (Addendum)
Remember to drink plenty of fluid, eat healthy meals and do not skip any meals. Try to eat protein with a every meal and eat a healthy snack such as fruit or nuts in between meals. Try to keep a regular sleep-wake schedule and try to exercise daily, particularly in the form of walking, 20-30 minutes a day, if you can.   As far as your medications are concerned, I would like to suggest: Continue Keppra  As far as diagnostic testing: MRI tomorrow  I would like to see you back in 3 months, sooner if we need to. Please call us with any interim questions, concerns, problems, updates or refill requests.   Our phone number is [phone]. We also have an after hours call service for urgent matters and there is a physician on-call for urgent questions. For any emergencies you know to call 911 or go to the nearest emergency room  Levetiracetam tablets What is this medicine? LEVETIRACETAM (lee ve tye RA se tam) is an antiepileptic drug. It is used with other medicines to treat certain types of seizures. This medicine may be used for other purposes; ask your health care provider or pharmacist if you have questions. What should I tell my health care provider before I take this medicine? They need to know if you have any of these conditions: -kidney disease -suicidal thoughts, plans, or attempt; a previous suicide attempt by you or a family member -an unusual or allergic reaction to levetiracetam, other medicines, foods, dyes, or preservatives -pregnant or trying to get pregnant -breast-feeding How should I use this medicine? Take this medicine by mouth with a glass of water. Follow the directions on the prescription label. Swallow the tablets whole. Do not crush or chew this medicine. You may take this medicine with or without food. Take your doses at regular intervals. Do not take your medicine more often than directed. Do not stop taking this medicine or any of your seizure medicines unless instructed  by your doctor or health care professional. Stopping your medicine suddenly can increase your seizures or their severity. A special MedGuide will be given to you by the pharmacist with each prescription and refill. Be sure to read this information carefully each time. Contact your pediatrician or health care professional regarding the use of this medication in children. While this drug may be prescribed for children as young as 105 years of age for selected conditions, precautions do apply. Overdosage: If you think you have taken too much of this medicine contact a poison control center or emergency room at once. NOTE: This medicine is only for you. Do not share this medicine with others. What if I miss a dose? If you miss a dose, take it as soon as you can. If it is almost time for your next dose, take only that dose. Do not take double or extra doses. What may interact with this medicine? This medicine may interact with the following medications: -carbamazepine -colesevelam -probenecid -sevelamer This list may not describe all possible interactions. Give your health care provider a list of all the medicines, herbs, non-prescription drugs, or dietary supplements you use. Also tell them if you smoke, drink alcohol, or use illegal drugs. Some items may interact with your medicine. What should I watch for while using this medicine? Visit your doctor or health care professional for a regular check on your progress. Wear a medical identification bracelet or chain to say you have epilepsy, and carry a card that lists all your medications.  It is important to take this medicine exactly as instructed by your health care professional. When first starting treatment, your dose may need to be adjusted. It may take weeks or months before your dose is stable. You should contact your doctor or health care professional if your seizures get worse or if you have any new types of seizures. You may get drowsy or dizzy.  Do not drive, use machinery, or do anything that needs mental alertness until you know how this medicine affects you. Do not stand or sit up quickly, especially if you are an older patient. This reduces the risk of dizzy or fainting spells. Alcohol may interfere with the effect of this medicine. Avoid alcoholic drinks. The use of this medicine may increase the chance of suicidal thoughts or actions. Pay special attention to how you are responding while on this medicine. Any worsening of mood, or thoughts of suicide or dying should be reported to your health care professional right away. Women who become pregnant while using this medicine may enroll in the [place] Antiepileptic Drug Pregnancy Registry by calling ([phone]. This registry collects information about the safety of antiepileptic drug use during pregnancy. What side effects may I notice from receiving this medicine? Side effects you should report to your doctor or health care professional as soon as possible: -allergic reactions like skin rash, itching or hives, swelling of the face, lips, or tongue -breathing problems -dark urine -general ill feeling or flu-like symptoms -problems with balance, talking, walking -unusually weak or tired -worsening of mood, thoughts or actions of suicide or dying -yellowing of the eyes or skin Side effects that usually do not require medical attention (report to your doctor or health care professional if they continue or are bothersome): -diarrhea -dizzy, drowsy -headache -loss of appetite This list may not describe all possible side effects. Call your doctor for medical advice about side effects. You may report side effects to FDA at 1-800-FDA-1088. Where should I keep my medicine? Keep out of reach of children. Store at room temperature between 15 and 30 degrees C (59 and 86 degrees F). Throw away any unused medicine after the expiration date. NOTE: This sheet is a summary. It may not  cover all possible information. If you have questions about this medicine, talk to your doctor, pharmacist, or health care provider.    2016, Elsevier/Gold Standard. (2013-07-13 08:42:48)

## 2016-03-27 NOTE — Progress Notes (Signed)
[facility]    Provider:  Dr Lucia Gaskins Referring Provider: Macy Mis, MD Primary Care Physician:  Delbert Harness, MD  CC: seizure  Interval history 03/28/2016: Patient's here with husband. Patient had another episode of convulsions and was seen at the emergency room and started on Keppra. Had a long discussion about her recent EEG that showed evidence of generalized discharges. Advised patient to stay on antiepileptic medications. Discussed seizures, she has her MRI scheduled soon. I recommend she stay on her antiepileptic medications. They've been causing her a lot of sedation and fatigue but she is feeling better. Discussed other possible medications we could use, she prefers to stay on Keppra over the next few weeks and see if side effects improve otherwise she will return so we can change her medications. Discussed seizures with her fiance as well. Discussed risks of seizures, advise compliance with medications as missing even one pill can cause of breakthrough seizure. She cannot drive or operate heavy machinery for 6 months. Discussed sudep.   CONCLUSION: This is an abnormal awake EEG.  There is electrodiagnostic evidence of generalized epileptiform discharge, especially during hyperventilation and shortly following hyperventilation, consistent with generalized epilepsy disorder. She is having side effects to the keppra. She is very dizzy. She is getting used to the medication. She is compliant with it twice a day.   HPI:  Kirsten Huang is a 45 y.o. female here as a referral from the emergency room for seizure. She has a past medical history of asthma, hypertension, diabetes, anxiety, depression, high cholesterol. She woke up on the 14th of this month and her fiance was very worried, in her face, he mouth hurt very bad, he said she was convulsing, she bit her tongue in 4 different spots. Her tongue was cut open. She was drooling. She would not wake up, she was very  tired. She remembers the emergency room. She denies any other associated symptoms. No Hx of seizures or FHx of seizures. She was attacked by a swarm of bees a week previous otherwise no inciting events or previous illnesses or head trauma. .A few months ago she woke up with a bitten tongue as well. Never had alteration of consciousness, or any other strange episodes in the past. Never urinated on herself. The episode lasted about 30 seconds. She denied any alcohol or drug use.denied any other focal neurologic deficits or symptoms.  Reviewed notes, labs and imaging from outside physicians, which showed:  CT of the head (personally reviewed and agree with the following): The ventricles are normal in size and configuration. There is invagination of CSF into the sella, consistent with a degree of empty sella. There is no intracranial mass, hemorrhage, extra-axial fluid collection, or midline shift. Gray-white compartments are normal. No acute infarct evident. The bony calvarium appears intact. The mastoid air cells are clear. Orbits appear symmetric and unremarkable bilaterally.  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 The ventricles are normal in size and configuration. There is no intracranial mass, hemorrhage, or focal gray -white compartment lesion.  BMP in the hospital was abnormal with potassium of 3,CBC was essentially normal  Reviewed emergency room notes.Patient presented on the 14th of this month to the ED after fianc told her that she was convulsing and choking. Patient had been having headaches and her tongue was bitten that evening as well as a few weeks ago. She also complained of chest pain radiating down the right arm. EKG Was stable as compared since last tracing.  They reported generalized shaking and tongue biting and unresponsiveness with postictal symptoms of confusion and memory loss and somnolence. The duration was 30 seconds. No alcohol. No recent head injuries.. CT of the  head was completed.Vitals were normal. Exam in the emergency room was normal, no mention of her tongue was made in exam. She was diagnosed with a seizure. Also reported as staring spell afterwards. They did note minor tongue and lip abrasions not incontinent during episodes. Workup was negative and patient was discharged with outpatient neurology follow-up.  In 2013 patient reported to the emergency room complaining of a panic attack after a stressful event    Review of Systems: Patient complains of symptoms per HPI as well as the following symptoms: joint pain, allergies. Pertinent negatives per HPI. All others negative.    Social History   Social History  . Marital status: Single    Spouse name: N/A  . Number of children: 4  . Years of education: 57   Occupational History  . Court system    Social History Main Topics  . Smoking status: Never Smoker  . Smokeless tobacco: Not on file  . Alcohol use No  . Drug use: Unknown  . Sexual activity: Not on file   Other Topics Concern  . Not on file   Social History Narrative   Lives with boyfriend   Caffeine use: soda daily       Family History  Problem Relation Age of Onset  . Cancer    . Diabetes    . Seizures Neg Hx     Past Medical History:  Diagnosis Date  . Anxiety   . Asthma   . Depression   . Diabetes (HCC)   . Headache   . High cholesterol   . Hypertension   . Stroke (cerebrum) (HCC) 2014    Past Surgical History:  Procedure Laterality Date  . TYMPANOSTOMY TUBE PLACEMENT      Current Outpatient Prescriptions  Medication Sig Dispense Refill  . aspirin 325 MG EC tablet Take 325 mg by mouth daily.    . Aspirin-Salicylamide-Caffeine (BC HEADACHE PO) Take 1 Package by mouth daily as needed (pain).    . celecoxib (CELEBREX) 200 MG capsule Take 200 mg by mouth daily.     . cetirizine (ZYRTEC) 10 MG tablet Take 10 mg by mouth daily as needed for allergies.    . hydrochlorothiazide (HYDRODIURIL) 25 MG  tablet Take 25 mg by mouth daily.     Marland Kitchen levETIRAcetam (KEPPRA) 500 MG tablet Take 1 tablet (500 mg total) by mouth 2 (two) times daily. 60 tablet 0  . sertraline (ZOLOFT) 100 MG tablet Take 100 mg by mouth daily.      No current facility-administered medications for this visit.     Allergies as of 03/27/2016  . (No Known Allergies)    Vitals: BP 136/77 (BP Location: Right Wrist, Patient Position: Sitting, Cuff Size: Normal)   Pulse 74   Ht 5\' 5"  (1.651 m)   Wt 278 lb 12.8 oz (126.5 kg)   BMI 46.39 kg/m  Last Weight:  Wt Readings from Last 1 Encounters:  03/27/16 278 lb 12.8 oz (126.5 kg)   Last Height:   Ht Readings from Last 1 Encounters:  03/27/16 5\' 5"  (1.651 m)       Exam: Gen: NAD, conversant, well nourised, obese, well groomed                     CV: RRR,  no MRG. No Carotid Bruits. No peripheral edema, warm, nontender Eyes: Conjunctivae clear without exudates or hemorrhage  Neuro: Detailed Neurologic Exam  Speech:    Speech is normal; fluent and spontaneous with normal comprehension.  Cognition:    The patient is oriented to person, place, and time;     recent and remote memory intact;     language fluent;     normal attention, concentration,     fund of knowledge Cranial Nerves:    The pupils are equal, round, and reactive to light. The fundi are normal and spontaneous venous pulsations are present. Visual fields are full to finger confrontation. Extraocular movements are intact. Trigeminal sensation is intact and the muscles of mastication are normal. The face is symmetric. The palate elevates in the midline. Hearing intact. Voice is normal. Shoulder shrug is normal. The tongue has normal motion without fasciculations.   Coordination:    Normal finger to nose and heel to shin. Normal rapid alternating movements.   Gait:    Heel-toe and tandem gait are normal.   Motor Observation:    No asymmetry, no atrophy, and no involuntary movements  noted. Tone:    Normal muscle tone.    Posture:    Posture is normal. normal erect    Strength:    Strength is V/V in the upper and lower limbs.      Sensation: intact to LT     Reflex Exam:  DTR's:    Deep tendon reflexes in the upper and lower extremities are normal bilaterally.   Toes:    The toes are downgoing bilaterally.   Clonus:    Clonus is absent.      Assessment/Plan:  45 year old with episodes of GTCS. Continue Keppra at current dose EEG was abnormal. MRI is pending.  Patient is unable to drive, operate heavy machinery, perform activities at heights or participate in water activities until 6 months seizure free  Discussed Patients with epilepsy have a small risk of sudden unexpected death, a condition referred to as sudden unexpected death in epilepsy (SUDEP). SUDEP is defined specifically as the sudden, unexpected, witnessed or unwitnessed, nontraumatic and nondrowning death in patients with epilepsy with or without evidence for a seizure, and excluding documented status epilepticus, in which post mortem examination does not reveal a structural or toxicologic cause for death      Naomie Dean, MD  [facility] [address] [place], [place] [address]  Phone [phone] Fax [phone]  A total of 30 minutes was spent in with this patient face-to-face. Over half this time was spent on counseling patient on the generalized seizure diagnosis and different therapeutic options available.

## 2016-03-28 ENCOUNTER — Ambulatory Visit
Admission: RE | Admit: 2016-03-28 | Discharge: 2016-03-28 | Disposition: A | Discharge status: Home / Self Care | Payer: BC Managed Care – PPO | Source: Ambulatory Visit | Attending: Neurology | Admitting: Neurology

## 2016-03-28 ENCOUNTER — Encounter: Payer: Self-pay | Admitting: Neurology

## 2016-03-28 DIAGNOSIS — R569 Unspecified convulsions: Secondary | ICD-10-CM

## 2016-03-28 DIAGNOSIS — G40309 Generalized idiopathic epilepsy and epileptic syndromes, not intractable, without status epilepticus: Secondary | ICD-10-CM | POA: Insufficient documentation

## 2016-03-28 MED ORDER — GADOBENATE DIMEGLUMINE 529 MG/ML IV SOLN
20.0000 mL | Freq: Once | INTRAVENOUS | Status: AC | PRN
  Administered 2016-03-28: 20 mL via INTRAVENOUS

## 2016-04-02 ENCOUNTER — Telehealth: Payer: Self-pay | Admitting: *Deleted

## 2016-04-02 NOTE — Telephone Encounter (Signed)
I explained to patient that seizures are very often idiopathic, often we do not know why people have seizures, it may be hereditary as well we do not know. TIAs are not a cause of seizures. thanks

## 2016-04-02 NOTE — Telephone Encounter (Signed)
Patient returned Emma's call, advised per Kara Mead, MRI showed no cause for her seizures, patient states "now I'm confused as to why I am having seizures". Please call [phone].

## 2016-04-02 NOTE — Telephone Encounter (Signed)
Called pt back. Explained Dr Lucia Gaskins is trying to rule things out. She did not find any cause for her seizures in her brain. She verbalized understanding. She states that about a  year/year and a half ago she had a TIA. She thinks her episodes started after this.  She is wondering if this could all be related. I advised I will send message to her and call her back to advise after she answers. She verbalized understanding.

## 2016-04-02 NOTE — Telephone Encounter (Signed)
-----   Message from Anson Fret, MD sent at 04/01/2016  6:27 PM EDT ----- MRI of the brain showed no cause for her seizures thanks

## 2016-04-02 NOTE — Telephone Encounter (Signed)
LVM for pt to call about results. Gave [facility] phone number.  Ok to inform pt MRI showed no cause for her seizures per Dr Lucia Gaskins.

## 2016-04-03 ENCOUNTER — Telehealth: Payer: Self-pay | Admitting: Neurology

## 2016-04-03 MED ORDER — LEVETIRACETAM 500 MG PO TABS: 500.0000 mg | ORAL_TABLET | Freq: Two times a day (BID) | ORAL | 2 refills | Status: DC

## 2016-04-03 NOTE — Telephone Encounter (Signed)
Sent rx request electronically to pt pharmacy as requested.

## 2016-04-03 NOTE — Telephone Encounter (Signed)
Patient called to request refill of levETIRAcetam (KEPPRA) 500 MG tablet to [facility] on [address].

## 2016-04-03 NOTE — Telephone Encounter (Signed)
LVM for pt to call back. Gave [facility] phone number.  

## 2016-04-04 NOTE — Telephone Encounter (Signed)
Called pt back. Relayed Dr Lucia Gaskins message. Pt verbalizd understanding. Advised refills sent yesterday to pharmacy for keppra as requested. She verbalized understanding.

## 2016-04-04 NOTE — Telephone Encounter (Signed)
Patient returned Emma's call, please call [phone].

## 2016-04-04 NOTE — Telephone Encounter (Signed)
LVM for pt again since no return call from pt. Asked pt to call. Gave [facility] phone number.

## 2016-05-01 ENCOUNTER — Telehealth: Payer: Self-pay | Admitting: Neurology

## 2016-05-01 ENCOUNTER — Telehealth: Payer: Self-pay | Admitting: *Deleted

## 2016-05-01 NOTE — Telephone Encounter (Signed)
Tried calling pt regarding forms she dropped off. Per Dr Lucia GaskinsAhern- next time, she needs to give us at least 14 days to complete. We just received paperwork this am from medical records to fill out. Dr Lucia GaskinsAhern will try and complete asap and we will call once ready.

## 2016-05-01 NOTE — Telephone Encounter (Signed)
Pt form at the front desk for pick up.

## 2016-05-01 NOTE — Telephone Encounter (Signed)
Pt called in regarding her paper work. She wants to know if she makes appt will Dr. Lucia GaskinsAhern fill the paper work while she is there? Please call and advise [phone]

## 2016-05-01 NOTE — Telephone Encounter (Signed)
Dr Lucia GaskinsAhern called and spoke to patient. Dr Lucia GaskinsAhern completed form. I gave go Hoy MornDebra S in medical records and she will place up front for pick up.

## 2016-05-01 NOTE — Telephone Encounter (Signed)
Patient called, states she stopped by our office yesterday to drop off form that needs to be signed by Dr. Lucia GaskinsAhern giving clearance for her to have dental extractions, states she was told someone would call her by 5pm yesterday. Dental extractions were scheduled for 7:30am this morning, procedure was cancelled due to not having medical clearance. Patient is going to try and get this rescheduled to tomorrow, please call to advise.

## 2016-05-29 ENCOUNTER — Encounter: Payer: Self-pay | Admitting: Neurology

## 2016-06-20 ENCOUNTER — Ambulatory Visit: Payer: BC Managed Care – PPO | Admitting: Neurology

## 2016-06-25 ENCOUNTER — Telehealth: Payer: Self-pay | Admitting: Neurology

## 2016-06-25 ENCOUNTER — Telehealth: Payer: Self-pay | Admitting: *Deleted

## 2016-06-25 NOTE — Telephone Encounter (Signed)
LMVM for pt to return call,  I asked if enough medication to get to her appointment tomorrow? (keppra)

## 2016-06-25 NOTE — Telephone Encounter (Signed)
Eber JonesCarolyn- just FiservFYI. She has appt with you tomorrow.

## 2016-06-25 NOTE — Telephone Encounter (Signed)
Pt called in to make appt. She wants to let the NP know or Dr. Lucia GaskinsAhern know she will need a refill on levETIRAcetam (KEPPRA) 500 MG tablet and it will need a PA. She also wants to address her driving again.

## 2016-06-26 ENCOUNTER — Ambulatory Visit (INDEPENDENT_AMBULATORY_CARE_PROVIDER_SITE_OTHER): Payer: BC Managed Care – PPO | Admitting: Nurse Practitioner

## 2016-06-26 ENCOUNTER — Encounter: Payer: Self-pay | Admitting: Nurse Practitioner

## 2016-06-26 ENCOUNTER — Ambulatory Visit: Payer: BC Managed Care – PPO | Admitting: Neurology

## 2016-06-26 VITALS — BP 124/78 | HR 64 | Ht 65.0 in | Wt 272.2 lb

## 2016-06-26 DIAGNOSIS — G40309 Generalized idiopathic epilepsy and epileptic syndromes, not intractable, without status epilepticus: Secondary | ICD-10-CM

## 2016-06-26 MED ORDER — LEVETIRACETAM 500 MG PO TABS: 500.0000 mg | ORAL_TABLET | Freq: Two times a day (BID) | ORAL | 1 refills | Status: DC

## 2016-06-26 NOTE — Progress Notes (Signed)
[facility]  PATIENT: Kirsten Huang DOB: 1970/12/14   REASON FOR VISIT: Follow-up for generalized convulsive epilepsy HISTORY FROM: Patient    HISTORY OF PRESENT ILLNESS: UPDATE 10/25/17CM Kirsten Huang, 45 year old returns for follow-up. She has history of generalized epilepsy. She was placed on Keppra at her last visit and states "it has changed my life". She is able to focus and do things that previously she could not do. She denies any side effects to the drug. She needs refills She works as a Therapist, occupational. She knows  she is not to drive  until December 14. She returns for reevaluation   Interval history 03/28/2016: AAPatient's here with husband. Patient had another episode of convulsions and was seen at the emergency room and started on Keppra. Had a long discussion about her recent EEG that showed evidence of generalized discharges. Advised patient to stay on antiepileptic medications. Discussed seizures, she has her MRI scheduled soon. I recommend she stay on her antiepileptic medications. They've been causing her a lot of sedation and fatigue but she is feeling better. Discussed other possible medications we could use, she prefers to stay on Keppra over the next few weeks and see if side effects improve otherwise she will return so we can change her medications. Discussed seizures with her fiance as well. Discussed risks of seizures, advise compliance with medications as missing even one pill can cause of breakthrough seizure. She cannot drive or operate heavy machinery for 6 months. Discussed sudep.  REVIEW OF SYSTEMS: Full 14 system review of systems performed and notable only for those listed, all others are neg:  Constitutional: neg  Cardiovascular: neg Ear/Nose/Throat: neg  Skin: neg Eyes: neg Respiratory: neg Gastroitestinal: neg  Hematology/Lymphatic: neg  Endocrine: neg Musculoskeletal: Joint pain Allergy/Immunology: neg Neurological: Seizure  disorder Psychiatric: neg Sleep : neg   ALLERGIES: Allergies  Allergen Reactions  . Gadolinium Derivatives Hives    PT HAD ONE HIVE ON RIGHT SIDE OF HEAD AT [facility] AFTER CONTRAST INJECTION. FIRST TIME SHE HAS HAD MRI CONTRAST. GIVEN 50MG  BENADRYL AND CHECKED BY DR MAYNARD, RADIOLOGIST.    HOME MEDICATIONS: Outpatient Medications Prior to Visit  Medication Sig Dispense Refill  . aspirin 325 MG EC tablet Take 325 mg by mouth daily.    . Aspirin-Salicylamide-Caffeine (BC HEADACHE PO) Take 1 Package by mouth daily as needed (pain).    . celecoxib (CELEBREX) 200 MG capsule Take 200 mg by mouth daily.     . cetirizine (ZYRTEC) 10 MG tablet Take 10 mg by mouth daily as needed for allergies.    . hydrochlorothiazide (HYDRODIURIL) 25 MG tablet Take 25 mg by mouth daily.     Marland Kitchen levETIRAcetam (KEPPRA) 500 MG tablet Take 1 tablet (500 mg total) by mouth 2 (two) times daily. 60 tablet 2  . sertraline (ZOLOFT) 100 MG tablet Take 100 mg by mouth daily.      No facility-administered medications prior to visit.     PAST MEDICAL HISTORY: Past Medical History:  Diagnosis Date  . Anxiety   . Asthma   . Depression   . Diabetes (HCC)   . Headache   . High cholesterol   . Hypertension   . Stroke (cerebrum) (HCC) 2014    PAST SURGICAL HISTORY: Past Surgical History:  Procedure Laterality Date  . TYMPANOSTOMY TUBE PLACEMENT      FAMILY HISTORY: Family History  Problem Relation Age of Onset  . Cancer    . Diabetes    . Seizures  Neg Hx     SOCIAL HISTORY: Social History   Social History  . Marital status: Single    Spouse name: N/A  . Number of children: 4  . Years of education: 16   Occupational History  . [employer] system    Social History Main Topics  . Smoking status: Never Smoker  . Smokeless tobacco: Never Used  . Alcohol use No  . Drug use: Unknown  . Sexual activity: Not on file   Other Topics Concern  . Not on file   Social History Narrative   Lives with boyfriend    Caffeine use: soda daily        PHYSICAL EXAM  Vitals:   06/26/16 0918  BP: 124/78  Pulse: 64  Weight: 272 lb 3.2 oz (123.5 kg)  Height: 5\' 5"  (1.651 m)   Body mass index is 45.3 kg/m.  Generalized: Well developed, Obese female in no acute distress  Head: normocephalic and atraumatic,. Oropharynx benign  Neck: Supple, no carotid bruits  Cardiac: Regular rate rhythm, no murmur  Musculoskeletal: No deformity   Neurological examination   Mentation: Alert oriented to time, place, history taking. Attention span and concentration appropriate. Recent and remote memory intact.  Follows all commands speech and language fluent.   Cranial nerve II-XII: Fundoscopic exam reveals sharp disc margins.Pupils were equal round reactive to light extraocular movements were full, visual field were full on confrontational test. Facial sensation and strength were normal. hearing was intact to finger rubbing bilaterally. Uvula tongue midline. head turning and shoulder shrug were normal and symmetric.Tongue protrusion into cheek strength was normal. Motor: normal bulk and tone, full strength in the BUE, BLE, fine finger movements normal, no pronator drift. No focal weakness Sensory: normal and symmetric to light touch, pinprick, and  Vibration, in the upper and lower extremities  Coordination: finger-nose-finger, heel-to-shin bilaterally, no dysmetria Reflexes: Symmetric upper and lower plantar responses were flexor bilaterally. Gait and Station: Rising up from seated position without assistance, normal stance,  moderate stride, good arm swing, smooth turning, able to perform tiptoe, and heel walking without difficulty. Tandem gait is steady  DIAGNOSTIC DATA (LABS, IMAGING, TESTING) - I reviewed patient records, labs, notes, testing and imaging myself where available.  Lab Results  Component Value Date   WBC 7.2 02/25/2016   HGB 13.1 02/25/2016   HCT 39.0 02/25/2016   MCV 81.1 02/25/2016   PLT  240 02/25/2016      Component Value Date/Time   NA 137 02/25/2016 1617   K 3.4 (L) 02/25/2016 1617   CL 103 02/25/2016 1617   CO2 28 02/25/2016 1617   GLUCOSE 106 (H) 02/25/2016 1617   BUN 15 02/25/2016 1617   CREATININE 0.90 02/25/2016 1617   CALCIUM 9.2 02/25/2016 1617   PROT 8.0 02/25/2016 1617   ALBUMIN 4.1 02/25/2016 1617   AST 20 02/25/2016 1617   ALT 20 02/25/2016 1617   ALKPHOS 47 02/25/2016 1617   BILITOT 0.3 02/25/2016 1617   GFRNONAA >60 02/25/2016 1617   GFRAA >60 02/25/2016 1617    ASSESSMENT AND PLAN  45 y.o. year old female  has a past medical history ofGeneralized seizure disorder with abnormal EEG. MRI of the brain showed no cause for her seizures.   PLANContinue Keppra twice daily will refill Call for any seizure activity Reviewed, and seizure triggers to include  Sleep deprivation, dehydration, overheating, stress, hypoglycemia or skipping meals, certain medications or excessive alcohol use, especially stopping alcohol abruptly if you have had heavy  alcohol use before (aka alcohol withdrawal seizure). If you have a prolonged seizure over 2-5 minutes or back to back seizures, call or have someone call 911 or take you to the nearest emergency room. You cannot drive a car or operate any other machinery or vehicle within 6 months of a seizure. You may return to driving on December 14 Please do not swim alone or take a tub bath for safety. Do not cook with large quantities of boiling water or oil for safety. Take your medicine for seizure prevention regularly and do not skip doses or stop medication abruptly and tone are told to do so by your healthcare provider. Try to get a refill on your antiepileptic medication ahead of time, so you are not at risk of running out. If you run out of the seizure medication and do not have a refill at hand she may run into medication withdrawal seizures. Avoid taking Wellbutrin, narcotic pain medications and tramadol, as they can lower  seizure threshold.  Follow-up in 6 months Vst time 25 min Nilda RiggsNancy Carolyn Martin, [facility] ServicesGNP, [facility] PlaceBC, APRN  [facility] [address], [address] [place], [place] [address] [phone]

## 2016-06-26 NOTE — Telephone Encounter (Signed)
Pt here for appt. Needs refill.  (no PA needed), she had noted on the bottle that authorization needed from us for refill.  I explained to pt.

## 2016-06-26 NOTE — Patient Instructions (Signed)
Continue Keppra twice daily will refill Call for any seizure activity Please remember, common seizure triggers are: Sleep deprivation, dehydration, overheating, stress, hypoglycemia or skipping meals, certain medications or excessive alcohol use, especially stopping alcohol abruptly if you have had heavy alcohol use before (aka alcohol withdrawal seizure). If you have a prolonged seizure over 2-5 minutes or back to back seizures, call or have someone call 911 or take you to the nearest emergency room. You cannot drive a car or operate any other machinery or vehicle within 6 months of a seizure. Please do not swim alone or take a tub bath for safety. Do not cook with large quantities of boiling water or oil for safety. Take your medicine for seizure prevention regularly and do not skip doses or stop medication abruptly and tone are told to do so by your healthcare provider. Try to get a refill on your antiepileptic medication ahead of time, so you are not at risk of running out. If you run out of the seizure medication and do not have a refill at hand she may run into medication withdrawal seizures. Avoid taking Wellbutrin, narcotic pain medications and tramadol, as they can lower seizure threshold.  Follow-up in 6 months

## 2016-07-04 NOTE — Progress Notes (Signed)
Personally have participated in and made any corrections needed to history, physical, neuro exam,assessment and plan as stated above.    Antonia Ahern, MD [facility] 

## 2016-07-06 ENCOUNTER — Other Ambulatory Visit: Payer: Self-pay | Admitting: Neurology

## 2016-11-06 ENCOUNTER — Emergency Department (HOSPITAL_COMMUNITY): Payer: BC Managed Care – PPO

## 2016-11-06 ENCOUNTER — Emergency Department (HOSPITAL_COMMUNITY)
Admission: EM | Admit: 2016-11-06 | Discharge: 2016-11-06 | Disposition: A | Discharge status: Home / Self Care | Payer: BC Managed Care – PPO | Attending: Emergency Medicine | Admitting: Emergency Medicine

## 2016-11-06 ENCOUNTER — Encounter (HOSPITAL_COMMUNITY): Payer: Self-pay | Admitting: *Deleted

## 2016-11-06 DIAGNOSIS — Z8673 Personal history of transient ischemic attack (TIA), and cerebral infarction without residual deficits: Secondary | ICD-10-CM | POA: Insufficient documentation

## 2016-11-06 DIAGNOSIS — R079 Chest pain, unspecified: Secondary | ICD-10-CM | POA: Diagnosis present

## 2016-11-06 DIAGNOSIS — R0789 Other chest pain: Secondary | ICD-10-CM

## 2016-11-06 DIAGNOSIS — Z79899 Other long term (current) drug therapy: Secondary | ICD-10-CM | POA: Insufficient documentation

## 2016-11-06 DIAGNOSIS — R072 Precordial pain: Secondary | ICD-10-CM

## 2016-11-06 DIAGNOSIS — E119 Type 2 diabetes mellitus without complications: Secondary | ICD-10-CM | POA: Diagnosis not present

## 2016-11-06 DIAGNOSIS — J45909 Unspecified asthma, uncomplicated: Secondary | ICD-10-CM | POA: Insufficient documentation

## 2016-11-06 DIAGNOSIS — I10 Essential (primary) hypertension: Secondary | ICD-10-CM | POA: Insufficient documentation

## 2016-11-06 DIAGNOSIS — Z7982 Long term (current) use of aspirin: Secondary | ICD-10-CM | POA: Diagnosis not present

## 2016-11-06 LAB — BASIC METABOLIC PANEL
ANION GAP: 8 (ref 5–15)
BUN: 9 mg/dL (ref 6–20)
CALCIUM: 8.6 mg/dL — AB (ref 8.9–10.3)
CO2: 24 mmol/L (ref 22–32)
CREATININE: 0.78 mg/dL (ref 0.44–1.00)
Chloride: 106 mmol/L (ref 101–111)
GFR calc Af Amer: >60 mL/min (ref >60)
GFR calc non Af Amer: >60 mL/min (ref >60)
GLUCOSE: 101 mg/dL — AB (ref 65–99)
Potassium: 3.2 mmol/L — ABNORMAL LOW (ref 3.5–5.1)
Sodium: 138 mmol/L (ref 135–145)

## 2016-11-06 LAB — CBC
HCT: 36.6 % (ref 36.0–46.0)
HEMOGLOBIN: 12.2 g/dL (ref 12.0–15.0)
MCH: 26.8 pg (ref 26.0–34.0)
MCHC: 33.3 g/dL (ref 30.0–36.0)
MCV: 80.3 fL (ref 78.0–100.0)
Platelets: 205 10*3/uL (ref 150–400)
RBC: 4.56 MIL/uL (ref 3.87–5.11)
RDW: 13.3 % (ref 11.5–15.5)
WBC: 5.1 10*3/uL (ref 4.0–10.5)

## 2016-11-06 LAB — TROPONIN I

## 2016-11-06 LAB — LIPASE, BLOOD: Lipase: 26 U/L (ref 11–51)

## 2016-11-06 NOTE — ED Notes (Signed)
Dr. Knott at bedside at this time.  

## 2016-11-06 NOTE — ED Triage Notes (Signed)
The pt is c/o epigastric pain  Just under her lt breast that radiates up ward to her mid-chest straight through  To her back  She ate food early this am and the pain started 1-2 hours after she ate  She still has her gb  lmp now

## 2016-11-06 NOTE — ED Provider Notes (Signed)
[facility]-EMERGENCY DEPT Provider Note   CSN: [id number] Arrival date & time: 11/06/16  1559   By signing my name below, I, Kirsten Huang, attest that this documentation has been prepared under the direction and in the presence of Kirsten Pulleyaniel Tuwanna Krausz, MD . Electronically Signed: Freida Busmaniana Huang, Scribe. 11/06/2016. 7:36 PM.   History   Chief Complaint Chief Complaint  Patient presents with  . Chest Pain   The history is provided by the patient. No language interpreter was used.    HPI Comments:  Kirsten Huang is a 46 y.o. female who presents to the Emergency Department complaining of central CP which began this afternoon. The pain began while in the waiting room of her Nutritionist and was advised to go to the ED at that time but she did not as she states it felt like indigestion. She went home and took a nap but when she woke up she felt a sharp stabbing pain in her central chest that radiated under her left breast. She also notes an episode of diaphoresis at that time that has resolved. Her pain is exacerbated by certain movements and with palpation. She has taken goody powder with mild relief. She denies daily use of the BC powder. Pt then called her PCP and was advised to come to the ED for further evaluation.  Past Medical History:  Diagnosis Date  . Anxiety   . Asthma   . Depression   . Diabetes (HCC)   . Headache   . High cholesterol   . Hypertension   . 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 Airway hyperreactivity 02/27/2016  . Back ache 02/27/2016  . Seizure (HCC) 02/22/2016  . Seizures (HCC) 02/20/2016  . Abnormal finding on thyroid function test 10/18/2015  . Borderline diabetes mellitus 07/14/2015  . Blush 07/14/2015  . Other specified symptoms and signs involving the digestive system and abdomen 07/14/2015  . Recurrent major depressive disorder, in partial remission  (HCC) 06/16/2015  . Avitaminosis D 11/10/2014  . Temporary cerebral vascular dysfunction 06/01/2014  . Morbid (severe) obesity due to excess calories (HCC) 01/17/2014  . Benign essential HTN 01/17/2014  . HLD (hyperlipidemia) 05/28/2013    Past Surgical History:  Procedure Laterality Date  . TYMPANOSTOMY TUBE PLACEMENT      OB History    No data available       Home Medications    Prior to Admission medications   Medication Sig Start Date End Date Taking? Authorizing Provider  aspirin 325 MG EC tablet Take 325 mg by mouth daily.    Historical Provider, MD  Aspirin-Salicylamide-Caffeine (BC HEADACHE PO) Take 1 Package by mouth daily as needed (pain).    Historical Provider, MD  celecoxib (CELEBREX) 200 MG capsule Take 200 mg by mouth daily.  01/30/16   Historical Provider, MD  cetirizine (ZYRTEC) 10 MG tablet Take 10 mg by mouth daily as needed for allergies.    Historical Provider, MD  hydrochlorothiazide (HYDRODIURIL) 25 MG tablet Take 25 mg by mouth daily.  12/01/15   Historical Provider, MD  levETIRAcetam (KEPPRA) 500 MG tablet Take 1 tablet (500 mg total) by mouth 2 (two) times daily. 06/26/16   Nilda RiggsNancy Carolyn Martin, NP  sertraline (ZOLOFT) 100 MG tablet Take 100 mg by mouth daily.  01/30/16   Historical Provider, MD    Family History Family History  Problem Relation Age of Onset  . Cancer    .  Diabetes    . Seizures Neg Hx     Social History Social History  Substance Use Topics  . Smoking status: Never Smoker  . Smokeless tobacco: Never Used  . Alcohol use No     Allergies   Gadolinium derivatives   Review of Systems Review of Systems  Constitutional: Positive for diaphoresis.  Respiratory: Negative for shortness of breath.   Cardiovascular: Positive for chest pain.  Gastrointestinal: Negative for vomiting.  Neurological: Negative for weakness.  All other systems reviewed and are negative.    Physical Exam Updated Vital Signs BP 139/90   Pulse 60    Temp 98.4 F (36.9 C) (Oral)   Resp 16   Ht 5\' 6"  (1.676 m)   Wt 273 lb (123.8 kg)   LMP 11/06/2016   SpO2 98%   BMI 44.06 kg/m   Physical Exam  Constitutional: She is oriented to person, place, and time. She appears well-developed and well-nourished. No distress.  HENT:  Head: Normocephalic.  Nose: Nose normal.  Eyes: Conjunctivae are normal.  Neck: Neck supple. No tracheal deviation present.  Cardiovascular: Normal rate and regular rhythm.   Pulmonary/Chest: Effort normal. No respiratory distress.  Mild tenderness over the xyphoid process in left sternal border   Abdominal: Soft. She exhibits no distension.  Neurological: She is alert and oriented to person, place, and time.  Skin: Skin is warm and dry.  Psychiatric: She has a normal mood and affect.  Nursing note and vitals reviewed.    ED Treatments / Results  DIAGNOSTIC STUDIES:  Oxygen Saturation is 98% on RA, normal by my interpretation.    COORDINATION OF CARE:  7:36 PM Discussed treatment plan with pt at bedside and pt agreed to plan.  Labs (all labs ordered are listed, but only abnormal results are displayed) Labs Reviewed  BASIC METABOLIC PANEL - Abnormal; Notable for the following:       Result Value   Potassium 3.2 (*)    Glucose, Bld 101 (*)    Calcium 8.6 (*)    All other components within normal limits  CBC  TROPONIN I  LIPASE, BLOOD    EKG  EKG Interpretation None       Radiology Dg Chest 2 View  Result Date: 11/06/2016 CLINICAL DATA:  Central chest pain EXAM: CHEST  2 VIEW COMPARISON:  02/14/2016 FINDINGS: The heart size and mediastinal contours are within normal limits. Both lungs are clear. Mild scoliosis. IMPRESSION: No active cardiopulmonary disease. Electronically Signed   By: Jasmine Pang M.D.   On: 11/06/2016 16:42    Procedures Procedures (including critical care time)  ED ECG REPORT   Date: 11/06/2016  Rate: 69 bpm  Rhythm: normal sinus rhythm  QRS Axis: normal   Intervals: normal  ST/T Wave abnormalities: normal  Conduction Disutrbances:none  Narrative Interpretation:   Old EKG Reviewed: unchanged  I have personally reviewed the EKG tracing and agree with the computerized printout as noted.  Medications Ordered in ED Medications - No data to display   Initial Impression / Assessment and Plan / ED Course  I have reviewed the triage vital signs and the nursing notes.  Pertinent labs & imaging results that were available during my care of the patient were reviewed by me and considered in my medical decision making (see chart for details).     46 year old female presents with precordial chest pain that is reproducible and highly atypical in nature. She describes it as sharp and underneath her left breast. It  was exacerbated by movement and possibly by food. She is in good spirits and was advised to come here by her primary care physician. Her EKG is unremarkable, her heart score is 2 due to age and risk factors with negative troponin here despite ongoing symptoms since yesterday.I recommended follow-up with primary care physician for recheck and monitoring of symptoms. She is on Celebrex which I advise she can increase to twice daily for her acute symptoms. We discussed yoga and other stretches and exercises to help with her symptoms in the interim.  Final Clinical Impressions(s) / ED Diagnoses   Final diagnoses:  Precordial chest pain  Chest wall pain    New Prescriptions New Prescriptions   No medications on file   I personally performed the services described in this documentation, which was scribed in my presence. The recorded information has been reviewed and is accurate.     Kirsten Pulley, MD 11/06/16 [phone]

## 2016-12-25 ENCOUNTER — Encounter (INDEPENDENT_AMBULATORY_CARE_PROVIDER_SITE_OTHER): Payer: Self-pay

## 2016-12-25 ENCOUNTER — Ambulatory Visit (INDEPENDENT_AMBULATORY_CARE_PROVIDER_SITE_OTHER): Payer: BC Managed Care – PPO | Admitting: Neurology

## 2016-12-25 ENCOUNTER — Encounter: Payer: Self-pay | Admitting: Neurology

## 2016-12-25 VITALS — BP 118/70 | HR 70 | Ht 65.5 in | Wt 267.8 lb

## 2016-12-25 DIAGNOSIS — G40309 Generalized idiopathic epilepsy and epileptic syndromes, not intractable, without status epilepticus: Secondary | ICD-10-CM

## 2016-12-25 MED ORDER — LEVETIRACETAM ER 500 MG PO TB24: 1000.0000 mg | ORAL_TABLET | Freq: Every day | ORAL | 11 refills | Status: DC

## 2016-12-25 NOTE — Patient Instructions (Signed)
Remember to drink plenty of fluid, eat healthy meals and do not skip any meals. Try to eat protein with a every meal and eat a healthy snack such as fruit or nuts in between meals. Try to keep a regular sleep-wake schedule and try to exercise daily, particularly in the form of walking, 20-30 minutes a day, if you can.   As far as your medications are concerned, I would like to suggest: Change to Keppra XR once daily  I would like to see you back in 1 year, sooner if we need to. Please call us with any interim questions, concerns, problems, updates or refill requests.   Our phone number is [phone]. We also have an after hours call service for urgent matters and there is a physician on-call for urgent questions. For any emergencies you know to call 911 or go to the nearest emergency room  Levetiracetam extended-release tablets What is this medicine? LEVETIRACETAM (lee ve tye RA se tam) is an antiepileptic drug. It is used with other medicines to treat certain types of seizures. This medicine may be used for other purposes; ask your health care provider or pharmacist if you have questions. COMMON BRAND NAME(S): Keppra XR What should I tell my health care provider before I take this medicine? They need to know if you have any of these conditions: -kidney disease -suicidal thoughts, plans, or attempt; a previous suicide attempt by you or a family member -an unusual or allergic reaction to levetiracetam, other medicines, foods, dyes, or preservatives -pregnant or trying to get pregnant -breast-feeding How should I use this medicine? Take this medicine by mouth with a glass of water. Follow the directions on the prescription label. Do not cut, crush or chew this medicine. You may take this medicine with or without food. Take your doses at regular intervals. Do not take your medicine more often than directed. Do not stop taking this medicine or any of your seizure medicines unless instructed by  your doctor or health care professional. Stopping your medicine suddenly can increase your seizures or their severity. A special MedGuide will be given to you by the pharmacist with each prescription and refill. Be sure to read this information carefully each time. Contact your pediatrician or health care professional regarding the use of this medication in children. While this drug may be prescribed for children as young as 74 years of age for selected conditions, precautions do apply. Overdosage: If you think you have taken too much of this medicine contact a poison control center or emergency room at once. NOTE: This medicine is only for you. Do not share this medicine with others. What if I miss a dose? If you miss a dose and it has only been a few hours, take it as soon as you can. If it is almost time for your next dose, take only that dose. Do not take double or extra doses. What may interact with this medicine? This medicine may interact with the following medications: -carbamazepine -colesevelam -probenecid -sevelamer This list may not describe all possible interactions. Give your health care provider a list of all the medicines, herbs, non-prescription drugs, or dietary supplements you use. Also tell them if you smoke, drink alcohol, or use illegal drugs. Some items may interact with your medicine. What should I watch for while using this medicine? Visit your doctor or health care professional for a regular check on your progress. Wear a medical identification bracelet or chain to say you have epilepsy, and carry  a card that lists all your medications. It is important to take this medicine exactly as instructed by your health care professional. When first starting treatment, your dose may need to be adjusted. It may take weeks or months before your dose is stable. You should contact your doctor or health care professional if your seizures get worse or if you have any new types of  seizures. You may get drowsy or dizzy. Do not drive, use machinery, or do anything that needs mental alertness until you know how this medicine affects you. Do not stand or sit up quickly, especially if you are an older patient. This reduces the risk of dizzy or fainting spells. Alcohol may interfere with the effect of this medicine. Avoid alcoholic drinks. The use of this medicine may increase the chance of suicidal thoughts or actions. Pay special attention to how you are responding while on this medicine. Any worsening of mood, or thoughts of suicide or dying should be reported to your health care professional right away. The tablet shell for some brands of this medicine does not dissolve. This is normal. The tablet shell may appear in the stool. This is not cause for concern. Women who become pregnant while using this medicine may enroll in the [place] American Antiepileptic Drug Pregnancy Registry by calling [phone])[phone]. This registry collects information about the safety of antiepileptic drug use during pregnancy. What side effects may I notice from receiving this medicine? Side effects that you should report to your doctor or health care professional as soon as possible: -allergic reactions like skin rash, itching or hives, swelling of the face, lips, or tongue -breathing problems -changes in emotions or moods -dark urine -general ill feeling or flu-like symptoms -problems with balance, talking, walking -suicidal thoughts or actions -unusually weak or tired -yellowing of the eyes or skin Side effects that usually do not require medical attention (report to your doctor or health care professional if they continue or are bothersome): -diarrhea -dizzy, drowsy -headache -loss of appetite This list may not describe all possible side effects. Call your doctor for medical advice about side effects. You may report side effects to FDA at 1-800-FDA-1088. Where should I keep my medicine? Keep out  of reach of children. Store at room temperature between 15 and 30 degrees C (59 and 86 degrees F). Throw away any unused medicine after the expiration date. NOTE: This sheet is a summary. It may not cover all possible information. If you have questions about this medicine, talk to your doctor, pharmacist, or health care provider.  2018 Elsevier/Gold Standard (2015-09-21 09:48:52)

## 2016-12-25 NOTE — Progress Notes (Signed)
[facility]    Provider:  Dr Lucia Gaskins Referring Provider: Macy Mis, MD Primary Care Physician:  Delbert Harness, MD  CC: seizure  Interval history 12/25/2016: Patient is here for follow up of GTCS with corresponding EEG findings.. Reviewed images with patient and MRI findings (agree with below). She is on Keppra  twice daily. The medication is fine, no side effects and no seizures. 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 Will switch to Keppra XR once daily.   IMPRESSION:  This MRI of the brain with and without contrast with adequate attention to the temporal lobes shows the following: 1.   Several punctate T2/FLAIR hyperintense foci in the deep white matter of both hemispheres consistent with chronic microvascular ischemic change. None of the foci appears to be acute. 2.   Very mild mucoperiosteal thickening in the right greater than left maxillary sinuses. 3.   There is a "empty sella". This is a nonspecific finding and could be idiopathic.   Elevated intracranial pressure can also cause this and further clinical correlation is suggested. 4.   The structures of the mesial temporal lobes appear normal and symmetric.   Interval history 03/28/2016: Patient's here with husband. Patient had another episode of convulsions and was seen at the emergency room and started on Keppra. Had a long discussion about her recent EEG that showed evidence of generalized discharges. Advised patient to stay on antiepileptic medications. Discussed seizures, she has her MRI scheduled soon. I recommend she stay on her antiepileptic medications. They've been causing her a lot of sedation and fatigue but she is feeling better. Discussed other possible medications we could use, she prefers to stay on Keppra over the next few weeks and see if side effects improve otherwise she will return so we can change her medications. Discussed seizures with her fiance as  well. Discussed risks of seizures, advise compliance with medications as missing even one pill can cause of breakthrough seizure. She cannot drive or operate heavy machinery for 6 months. Discussed sudep.   CONCLUSION: This is an abnormal awake EEG. There is electrodiagnostic evidence of generalized epileptiform discharge, especially during hyperventilation and shortly following hyperventilation, consistent with generalized epilepsy disorder. She is having side effects to the keppra. She is very dizzy. She is getting used to the medication. She is compliant with it twice a day.   VWU:[id number] N Fosteris a 46 y.o.femalehere as a referral from the emergency room for seizure. She has a past medical history of asthma, hypertension, diabetes, anxiety, depression, high cholesterol. She woke up on the 14th of this month and her fiance was very worried, in her face, he mouth hurt very bad, he said she was convulsing, she bit her tongue in 4 different spots. Her tongue was cut open. She was drooling. She would not wake up, she was very tired. She remembers the emergency room. She denies any other associated symptoms. No Hx of seizures or FHx of seizures. She was attacked by a swarm of bees a week previous otherwise no inciting events or previous illnesses or head trauma. .A few months ago she woke up with a bitten tongue as well. Never had alteration of consciousness, or any other strange episodes in the past. Never urinated on herself. The episode lasted about 30 seconds. She denied any alcohol or drug use.denied any other focal neurologic deficits or symptoms.  Reviewed notes, labs and imaging from outside physicians, which showed:  CT of the head (personally  reviewed and agree with the following): The ventricles are normal in size and configuration. There is invagination of CSF into the sella, consistent with a degree of empty sella. There is no intracranial mass, hemorrhage, extra-axial fluid  collection, or midline shift. Gray-white compartments are normal. No acute infarct evident. The bony calvarium appears intact. The mastoid air cells are clear. Orbits appear symmetric and unremarkable bilaterally.  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 The ventricles are normal in size and configuration. There is no intracranial mass, hemorrhage, or focal gray -white compartment lesion.  BMP in the hospital was abnormal with potassium of 3,CBC was essentially normal   Social History   Social History  . Marital status: Single    Spouse name: N/A  . Number of children: 4  . Years of education: 50   Occupational History  . [employer]    Social History Main Topics  . Smoking status: Never Smoker  . Smokeless tobacco: Never Used  . Alcohol use No  . Drug use: No  . Sexual activity: Not on file   Other Topics Concern  . Not on file   Social History Narrative   Lives with boyfriend   Caffeine use: soda daily       Family History  Problem Relation Age of Onset  . Cancer    . Diabetes    . Seizures Neg Hx     Past Medical History:  Diagnosis Date  . Anxiety   . Asthma   . Depression   . Diabetes (HCC)   . Headache   . High cholesterol   . Hypertension   . Seizure (HCC)   . Stroke (cerebrum) (HCC) 2014    Past Surgical History:  Procedure Laterality Date  . TYMPANOSTOMY TUBE PLACEMENT      Current Outpatient Prescriptions  Medication Sig Dispense Refill  . aspirin 325 MG EC tablet Take 325 mg by mouth daily.    . Aspirin-Salicylamide-Caffeine (BC HEADACHE PO) Take 1 Package by mouth daily as needed (pain).    . celecoxib (CELEBREX) 200 MG capsule Take 200 mg by mouth daily.     . cetirizine (ZYRTEC) 10 MG tablet Take 10 mg by mouth daily as needed for allergies.    . hydrochlorothiazide (HYDRODIURIL) 25 MG tablet Take 25 mg by mouth daily.     Marland Kitchen levETIRAcetam (KEPPRA) 500 MG tablet Take 1 tablet (500 mg total) by mouth 2 (two) times daily. 180  tablet 1  . sertraline (ZOLOFT) 100 MG tablet Take 100 mg by mouth daily.      No current facility-administered medications for this visit.     Allergies as of 12/25/2016 - Review Complete 12/25/2016  Allergen Reaction Noted  . Gadolinium derivatives Hives 03/28/2016    Vitals: BP 118/70   Pulse 70   Ht 5' 5.5" (1.664 m)   Wt 267 lb 12.8 oz (121.5 kg)   BMI 43.89 kg/m  Last Weight:  Wt Readings from Last 1 Encounters:  12/25/16 267 lb 12.8 oz (121.5 kg)   Last Height:   Ht Readings from Last 1 Encounters:  12/25/16 5' 5.5" (1.664 m)     Exam: Gen: NAD, conversant, well nourised, obese, well groomed  CV: RRR, no MRG. No Carotid Bruits. No peripheral edema, warm, nontender Eyes: Conjunctivae clear without exudates or hemorrhage  Neuro: Detailed Neurologic Exam  Speech: Speech is normal; fluent and spontaneous with normal comprehension.  Cognition: The patient is oriented to person, place, and time;  recent and remote memory intact;  language fluent;  normal attention, concentration,  fund of knowledge Cranial Nerves: The pupils are equal, round, and reactive to light. The fundi are normal and spontaneous venous pulsations are present. Visual fields are full to finger confrontation. Extraocular movements are intact. Trigeminal sensation is intact and the muscles of mastication are normal. The face is symmetric. The palate elevates in the midline. Hearing intact. Voice is normal. Shoulder shrug is normal. The tongue has normal motion without fasciculations.   Coordination: Normal finger to nose and heel to shin. Normal rapid alternating movements.   Gait: Heel-toe and tandem gait are normal.   Motor Observation: No asymmetry, no atrophy, and no involuntary movements noted. Tone: Normal muscle tone.   Posture: Posture is normal. normal erect  Strength: Strength is V/V in the upper and lower  limbs.   Sensation: intact to LT  Reflex Exam:  DTR's: Deep tendon reflexes in the upper and lower extremities are normal bilaterally.  Toes: The toes are downgoing bilaterally.  Clonus: Clonus is absent.     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 Continue Keppra at current dose but change to XR due to missing her second dose often due to shift work. EEG was abnormal. MRI is pending.  Patient is unable to drive, operate heavy machinery, perform activities at heights or participate in water activities until 6 months seizure free  Discussed Patients with epilepsy have a small risk of sudden unexpected death, a condition referred to as sudden unexpected death in epilepsy (SUDEP). SUDEP is defined specifically as the sudden, unexpected, witnessed or unwitnessed, nontraumatic and nondrowning death in patients with epilepsy with or without evidence for a seizure, and excluding documented status epilepticus, in which post mortem examination does not reveal a structural or toxicologic cause for death      Naomie Dean, MD  [facility] [address] [place], [place] [address]  Phone [phone] Fax [phone]  A total of 25 minutes was spent in with this patient face-to-face. Over half this time was spent on counseling patient on the generalized seizure diagnosis and different therapeutic options available.

## 2017-07-16 ENCOUNTER — Other Ambulatory Visit: Payer: Self-pay

## 2017-07-16 ENCOUNTER — Encounter (HOSPITAL_COMMUNITY): Payer: Self-pay

## 2017-07-16 DIAGNOSIS — R569 Unspecified convulsions: Secondary | ICD-10-CM | POA: Diagnosis present

## 2017-07-16 DIAGNOSIS — R1031 Right lower quadrant pain: Secondary | ICD-10-CM | POA: Insufficient documentation

## 2017-07-16 DIAGNOSIS — R0789 Other chest pain: Secondary | ICD-10-CM | POA: Insufficient documentation

## 2017-07-16 DIAGNOSIS — Z7982 Long term (current) use of aspirin: Secondary | ICD-10-CM | POA: Diagnosis not present

## 2017-07-16 DIAGNOSIS — R102 Pelvic and perineal pain: Secondary | ICD-10-CM | POA: Diagnosis not present

## 2017-07-16 DIAGNOSIS — E119 Type 2 diabetes mellitus without complications: Secondary | ICD-10-CM | POA: Insufficient documentation

## 2017-07-16 DIAGNOSIS — I10 Essential (primary) hypertension: Secondary | ICD-10-CM | POA: Insufficient documentation

## 2017-07-16 DIAGNOSIS — J45909 Unspecified asthma, uncomplicated: Secondary | ICD-10-CM | POA: Diagnosis not present

## 2017-07-16 LAB — BASIC METABOLIC PANEL
ANION GAP: 9 (ref 5–15)
BUN: 7 mg/dL (ref 6–20)
CALCIUM: 8.8 mg/dL — AB (ref 8.9–10.3)
CO2: 21 mmol/L — AB (ref 22–32)
Chloride: 105 mmol/L (ref 101–111)
Creatinine, Ser: 0.84 mg/dL (ref 0.44–1.00)
Glucose, Bld: 119 mg/dL — ABNORMAL HIGH (ref 65–99)
Potassium: 3.4 mmol/L — ABNORMAL LOW (ref 3.5–5.1)
Sodium: 135 mmol/L (ref 135–145)

## 2017-07-16 LAB — URINALYSIS, ROUTINE W REFLEX MICROSCOPIC
BILIRUBIN URINE: NEGATIVE
Glucose, UA: NEGATIVE mg/dL
Hgb urine dipstick: NEGATIVE
KETONES UR: NEGATIVE mg/dL
LEUKOCYTES UA: NEGATIVE
NITRITE: NEGATIVE
PROTEIN: NEGATIVE mg/dL
Specific Gravity, Urine: 1.027 (ref 1.005–1.030)
pH: 5 (ref 5.0–8.0)

## 2017-07-16 LAB — CBC WITH DIFFERENTIAL/PLATELET
BASOS PCT: 0 %
Basophils Absolute: 0 10*3/uL (ref 0.0–0.1)
Eosinophils Absolute: 0 10*3/uL (ref 0.0–0.7)
Eosinophils Relative: 0 %
HEMATOCRIT: 36.9 % (ref 36.0–46.0)
HEMOGLOBIN: 12.2 g/dL (ref 12.0–15.0)
LYMPHS ABS: 1.8 10*3/uL (ref 0.7–4.0)
LYMPHS PCT: 18 %
MCH: 26.1 pg (ref 26.0–34.0)
MCHC: 33.1 g/dL (ref 30.0–36.0)
MCV: 78.8 fL (ref 78.0–100.0)
MONOS PCT: 3 %
Monocytes Absolute: 0.3 10*3/uL (ref 0.1–1.0)
NEUTROS ABS: 8 10*3/uL — AB (ref 1.7–7.7)
NEUTROS PCT: 79 %
Platelets: 257 10*3/uL (ref 150–400)
RBC: 4.68 MIL/uL (ref 3.87–5.11)
RDW: 14.3 % (ref 11.5–15.5)
WBC: 10.1 10*3/uL (ref 4.0–10.5)

## 2017-07-16 LAB — HCG, QUANTITATIVE, PREGNANCY: hCG, Beta Chain, Quant, S: <1 m[IU]/mL (ref <5)

## 2017-07-16 LAB — I-STAT BETA HCG BLOOD, ED (MC, WL, AP ONLY): HCG, QUANTITATIVE: 7 m[IU]/mL — AB (ref <5)

## 2017-07-16 NOTE — ED Triage Notes (Signed)
Pt husband reports she had 2 seizures today- the first one at ~1100 am and she was sitting in chair and lowered to floor. At that time pt bit tongue. Husband reports 2nd seizure began around 3pm  And he found her on the floor beside chair in office. Pt reports she was dx with epilepsy ~ 1 year ago. Last seizure 1 month ago but states it was mild and she has never experienced 2 in the same day. Pt reports she is also having abdominal pain after taking BC powder (reports she takes frequently). Pt takes Keppra for epilepsy

## 2017-07-17 ENCOUNTER — Emergency Department (HOSPITAL_COMMUNITY): Payer: BC Managed Care – PPO

## 2017-07-17 ENCOUNTER — Emergency Department (HOSPITAL_COMMUNITY)
Admission: EM | Admit: 2017-07-17 | Discharge: 2017-07-17 | Disposition: A | Discharge status: Home / Self Care | Payer: BC Managed Care – PPO | Attending: Emergency Medicine | Admitting: Emergency Medicine

## 2017-07-17 DIAGNOSIS — R569 Unspecified convulsions: Secondary | ICD-10-CM

## 2017-07-17 DIAGNOSIS — R0789 Other chest pain: Secondary | ICD-10-CM

## 2017-07-17 DIAGNOSIS — R1031 Right lower quadrant pain: Secondary | ICD-10-CM

## 2017-07-17 LAB — HEPATIC FUNCTION PANEL
ALK PHOS: 53 U/L (ref 38–126)
ALT: 16 U/L (ref 14–54)
AST: 26 U/L (ref 15–41)
Albumin: 3.7 g/dL (ref 3.5–5.0)
BILIRUBIN INDIRECT: 0.6 mg/dL (ref 0.3–0.9)
Bilirubin, Direct: 0.1 mg/dL (ref 0.1–0.5)
TOTAL PROTEIN: 7.4 g/dL (ref 6.5–8.1)
Total Bilirubin: 0.7 mg/dL (ref 0.3–1.2)

## 2017-07-17 LAB — I-STAT TROPONIN, ED: Troponin i, poc: 0 ng/mL (ref 0.00–0.08)

## 2017-07-17 LAB — LIPASE, BLOOD: Lipase: 35 U/L (ref 11–51)

## 2017-07-17 MED ORDER — SODIUM CHLORIDE 0.9 % IV SOLN
1000.0000 mg | Freq: Once | INTRAVENOUS | Status: AC
  Administered 2017-07-17: 1000 mg via INTRAVENOUS
  Filled 2017-07-17: qty 10

## 2017-07-17 NOTE — ED Notes (Signed)
Patient transported to x-ray. ?

## 2017-07-17 NOTE — ED Provider Notes (Addendum)
TIME SEEN: 4:24 AM  CHIEF COMPLAINT: Multiple complaints  HPI: Patient is a 46 year old female with history of hypertension, diabetes, hyperlipidemia, epilepsy on Keppra 1000 mg extended release daily, previous stroke who presents to the emergency department with multiple complaints.  Patient states that her main reason for being here is that she had 2 seizures today.  Her significant other describes them as generalized tonic-clonic and she did bite the tip of her tongue.  It was postictal afterwards.  Also has had intermittent central sharp chest pain but none currently.  No associated shortness of breath.  No fevers, cough, vomiting or diarrhea.  Also reports abdominal pain mostly in the right lower quadrant.  Denies vaginal bleeding or discharge.  No dysuria or hematuria.  But this started while here in the emergency department.  No dysuria or hematuria.  She denies any new numbness, tingling or focal weakness.  She has some numbness in the right leg and right forearm which she reports is chronic.  Denies missing any of her medications recently other than her dose tonight.  She denies any head injury.  States her first seizure happened while in a chair and she did not fall to the floor.  Second seizure happened while sitting in chair and she did slump over into the floor.  No incontinence.  Husband describes these as typical of her previous seizures.  She is [facility]  She does report increased stress recently.  ROS: See HPI Constitutional: no fever  Eyes: no drainage  ENT: no runny nose   Cardiovascular:   chest pain  Resp: no SOB  GI: no vomiting GU: no dysuria Integumentary: no rash  Allergy: no hives  Musculoskeletal: no leg swelling  Neurological: no slurred speech ROS otherwise negative  PAST MEDICAL HISTORY/PAST SURGICAL HISTORY:  Past Medical History:  Diagnosis Date  . Anxiety   . Asthma   . Depression   . Diabetes (HCC)   . Headache   .  High cholesterol   . Hypertension   . Seizure (HCC)   . Stroke (cerebrum) [facility](HCC) 2014    MEDICATIONS:  Prior to Admission medications   Medication Sig Start Date End Date Taking? Authorizing Provider  aspirin 325 MG EC tablet Take 325 mg by mouth daily.    [provider]  Aspirin-Salicylamide-Caffeine (BC HEADACHE PO) Take 1 Package by mouth daily as needed (pain).    [provider]  celecoxib (CELEBREX) 200 MG capsule Take 200 mg by mouth daily.  01/30/16   [provider]  cetirizine (ZYRTEC) 10 MG tablet Take 10 mg by mouth daily as needed for allergies.    [provider]  hydrochlorothiazide (HYDRODIURIL) 25 MG tablet Take 25 mg by mouth daily.  12/01/15   [provider]  levETIRAcetam (KEPPRA XR) 500 MG 24 hr tablet Take 2 tablets (1,000 mg total) by mouth daily. 12/25/16   Anson FretAhern, Antonia B, MD  omeprazole (PRILOSEC) 20 MG capsule  12/20/16   [provider]  sertraline (ZOLOFT) 100 MG tablet Take 100 mg by mouth daily.  01/30/16   [provider]    ALLERGIES:  Allergies  Allergen Reactions  . Gadolinium Derivatives Hives    PT HAD ONE HIVE ON RIGHT SIDE OF HEAD AT [facility] AFTER CONTRAST INJECTION. FIRST TIME SHE HAS HAD MRI CONTRAST. GIVEN 50MG  BENADRYL AND CHECKED BY DR MAYNARD, RADIOLOGIST.    SOCIAL HISTORY:  Social History   Tobacco Use  . Smoking status:  Never Smoker  . Smokeless tobacco: Never Used  Substance Use Topics  . Alcohol use: No    FAMILY HISTORY: Family History  Problem Relation Age of Onset  . Cancer Unknown   . Diabetes Unknown   . Seizures Neg Hx     EXAM: BP 105/64   Pulse 69   Temp 98.5 F (36.9 C) (Oral)   Resp 16   LMP 06/25/2017   SpO2 98%  CONSTITUTIONAL: Alert and oriented and responds appropriately to questions. Well-appearing; well-nourished; GCS 15, obese HEAD: Normocephalic; atraumatic EYES: Conjunctivae clear, PERRL, EOMI ENT: normal nose; no rhinorrhea; moist  mucous membranes; pharynx without lesions noted; no dental injury; no septal hematoma, no dental injury, small abrasion to the tip of her tongue, no tongue laceration NECK: Supple, no meningismus, no LAD; no midline spinal tenderness, step-off or deformity; trachea midline CARD: RRR; S1 and S2 appreciated; no murmurs, no clicks, no rubs, no gallops RESP: Normal chest excursion without splinting or tachypnea; breath sounds clear and equal bilaterally; no wheezes, no rhonchi, no rales; no hypoxia or respiratory distress CHEST:  chest wall stable, no crepitus or ecchymosis or deformity, nontender to palpation; no flail chest ABD/GI: Normal bowel sounds; non-distended; soft, tender in the right lower quadrant at McBurney's point, no rebound, no guarding; no ecchymosis or other lesions noted PELVIS:  stable, nontender to palpation BACK:  The back appears normal and is non-tender to palpation, there is no CVA tenderness; no midline spinal tenderness, step-off or deformity EXT: Normal ROM in all joints; non-tender to palpation; no edema; normal capillary refill; no cyanosis, no bony tenderness or bony deformity of patient's extremities, no joint effusion, compartments are soft, extremities are warm and well-perfused, no ecchymosis SKIN: Normal color for age and race; warm NEURO: Moves all extremities equally; Strength 5/5 in all four extremities.  Normal sensation diffusely.  CN 2-12 grossly intact.  No dysmetria to finger to nose testing bilaterally.  Normal speech.   PSYCH: The patient's mood and manner are appropriate. Grooming and personal hygiene are appropriate.  MEDICAL DECISION MAKING: Patient here with 2 seizures today.  Described as generalized tonic-clonic with postictal state.  Having intermittent sharp chest pain.  Doubt PE or dissection.  She is not tachycardic or tachypneic or hypoxic.  EKG shows no risk normality.  Will obtain troponin and chest x-ray.  No new focal neurologic deficits today.   She is having abdominal pain that started while in the waiting room.  For me she is tender in the right lower quadrant at McBurney's point.  Will obtain CT scan for further evaluation.  I do not think she needs a pelvic exam at this time.  She has no GU symptoms and her pain is not in the pelvic region.  Doubt torsion, TOA, PID.  She declines pain medication at this time.  We will give her a dose of her IV Keppra.  Once her workup is complete if there is nothing new causing her symptoms, will discuss with neurology for their recommendations.  Of note, patient's initial i-STAT hCG was slightly positive at 7.0 but her lab quantitative hCG is negative.  I think the i-STAT hCG was erroneous.  ED PROGRESS: Patient's LFTs, lipase normal.  Troponin negative.  Chest x-ray clear.  CT of the abdomen pelvis shows no acute abnormality.  Will discuss with neurology further recommendations.  She has received IV Keppra here.   Discussed with Dr. Wilford CornerArora with neurology.  He states that we could change her to  Keppra 500 mg twice a day or she could follow-up with her neurologist as an outpatient for recommendations.  She would prefer to stay on Keppra 1000 mg extended release daily as she has a hard time remembering to take pills.  I think that increased stress and the possibility of missing medications could have caused her seizures today.  She is not driving.  She is comfortable with the plan to follow-up with her neurologist as an outpatient.  We will provide her with a work note.  She states she has plenty of her medications at home.   At this time, I do not feel there is any life-threatening condition present. I have reviewed and discussed all results (EKG, imaging, lab, urine as appropriate) and exam findings with patient/family. I have reviewed nursing notes and appropriate previous records.  I feel the patient is safe to be discharged home without further emergent workup and can continue workup as an outpatient as  needed. Discussed usual and customary return precautions. Patient/family verbalize understanding and are comfortable with this plan.  Outpatient follow-up has been provided if needed. All questions have been answered.    EKG Interpretation  Date/Time:  Wednesday July 16 2017 22:36:28 EST Ventricular Rate:  87 PR Interval:  134 QRS Duration: 82 QT Interval:  376 QTC Calculation: 452 R Axis:   -30 Text Interpretation:  Normal sinus rhythm Left axis deviation Nonspecific T wave abnormality Abnormal ECG No significant change since last tracing Confirmed by Silvia Hightower, Baxter Hire [phone]) on 07/17/2017 4:03:24 AM         Eulene Pekar, Layla Maw, DO 07/17/17 0644    Laina Guerrieri, Layla Maw, DO 07/17/17 6045

## 2017-07-17 NOTE — ED Notes (Signed)
MD at bedside. 

## 2017-07-17 NOTE — Discharge Instructions (Signed)
Please follow-up with your neurologist to discuss any potential changes in your seizure medications.  Your workup here today was normal.  We have given you a dose of IV Keppra.  You may resume this medication tonight.

## 2017-07-17 NOTE — ED Notes (Signed)
Patient transported to CT 

## 2017-07-17 NOTE — ED Notes (Signed)
Patient verbalized understanding of discharge instructions and denies any further needs or questions at this time. VS stable. Patient ambulatory with steady gait. Escorted to ED entrance in wheelchair.   

## 2017-07-30 ENCOUNTER — Telehealth: Payer: Self-pay | Admitting: *Deleted

## 2017-07-30 ENCOUNTER — Encounter: Payer: Self-pay | Admitting: *Deleted

## 2017-07-30 NOTE — Telephone Encounter (Signed)
Patient called back and stated she would like to f/u.  [facility] arrival time 4:00 pm.

## 2017-07-30 NOTE — Telephone Encounter (Signed)
Called patient and LVM (ok per DPR) asking patient to call our office if she would like to f/u with Dr. Lucia GaskinsAhern regarding her recent ED visit for seizures.

## 2017-08-06 ENCOUNTER — Ambulatory Visit: Payer: BC Managed Care – PPO | Admitting: Neurology

## 2017-08-06 ENCOUNTER — Encounter: Payer: Self-pay | Admitting: Neurology

## 2017-08-06 VITALS — BP 121/79 | HR 79 | Ht 65.0 in | Wt 260.2 lb

## 2017-08-06 DIAGNOSIS — G40919 Epilepsy, unspecified, intractable, without status epilepticus: Secondary | ICD-10-CM | POA: Diagnosis not present

## 2017-08-06 MED ORDER — LEVETIRACETAM ER 500 MG PO TB24: 1500.0000 mg | ORAL_TABLET | Freq: Every day | ORAL | 11 refills | Status: DC

## 2017-08-06 NOTE — Patient Instructions (Addendum)
Increase Levetiracetam XR (Keppra XR) to 3 pills (1500mg ) daily   Epilepsy Epilepsy is a condition in which a person has repeated seizures over time. A seizure is a sudden burst of abnormal electrical and chemical activity in the brain. Seizures can cause a change in attention, behavior, or the ability to remain awake and alert (altered mental status). Epilepsy increases a person's risk of falls, accidents, and injury. It can also lead to complications, including:  Depression.  Poor memory.  Sudden unexplained death in epilepsy (SUDEP). This complication is rare, and its cause is not known.  Most people with epilepsy lead normal lives. What are the causes? This condition may be caused by:  A head injury.  An injury that happens at birth.  A high fever during childhood.  A stroke.  Bleeding that goes into or around the brain.  Certain medicines and drugs.  Having too little oxygen for a long period of time.  Abnormal brain development.  Certain infections, such as meningitis and encephalitis.  Brain tumors.  Conditions that are passed along from parent to child (are hereditary).  What are the signs or symptoms? Symptoms of a seizure vary greatly from person to person. They include:  Convulsions.  Stiffening of the body.  Involuntary movements of the arms or legs.  Loss of consciousness.  Breathing problems.  Falling suddenly.  Confusion.  Head nodding.  Eye blinking or fluttering.  Lip smacking.  Drooling.  Rapid eye movements.  Grunting.  Loss of bladder control and bowel control.  Staring.  Unresponsiveness.  Some people have symptoms right before a seizure happens (aura) and right after a seizure happens. Symptoms of an aura include:  Fear or anxiety.  Nausea.  Feeling like the room is spinning (vertigo).  A feeling of having seen or heard something before (deja vu).  Odd tastes or smells.  Changes in vision, such as seeing  flashing lights or spots.  Symptoms that follow a seizure include:  Confusion.  Sleepiness.  Headache.  How is this diagnosed? This condition is diagnosed based on:  Your symptoms.  Your medical history.  A physical exam.  A neurological exam. A neurological exam is similar to a physical exam. It involves checking your strength, reflexes, coordination, and sensations.  Tests, such as: ? An electroencephalogram (EEG). This is a painless test that creates a diagram of your brain waves. ? An MRI of the brain. ? A CT scan of the brain. ? A lumbar puncture, also called a spinal tap. ? Blood tests to check for signs of infection or abnormal blood chemistry.  How is this treated? There is no cure for this condition, but treatment can help control seizures. Treatment may involve:  Taking medicines to control seizures. These include medicines to prevent seizures and medicines to stop seizures as they occur.  Having a device called a vagus nerve stimulator implanted in the chest. The device sends electrical impulses to the vagus nerve and to the brain to prevent seizures. This treatment may be recommended if medicines do not help.  Brain surgery. There are several kinds of surgeries that may be done to stop seizures from happening or to reduce how often seizures happen.  Having regular blood tests. You may need to have blood tests regularly to check that you are getting the right amount of medicine.  Once this condition has been diagnosed, it is important to begin treatment as soon as possible. For some people, epilepsy eventually goes away. Follow these instructions  at home: Medicines   Take over-the-counter and prescription medicines only as told by your health care provider.  Avoid any substances that may prevent your medicine from working properly, such as alcohol. Activity  Get enough rest. Lack of sleep can make seizures more likely to occur.  Follow instructions from  your health care provider about driving, swimming, and doing any other activities that would be dangerous if you had a seizure. Educating others Teach friends and family what to do if you have a seizure. They should:  Lay you on the ground to prevent a fall.  Cushion your head and body.  Loosen any tight clothing around your neck.  Turn you on your side. If vomiting occurs, this helps keep your airway clear.  Stay with you until you recover.  Not hold you down. Holding you down will not stop the seizure.  Not put anything in your mouth.  Know whether or not you need emergency care.  General instructions  Avoid anything that has ever triggered a seizure for you.  Keep a seizure diary. Record what you remember about each seizure, especially anything that might have triggered the seizure.  Keep all follow-up visits as told by your health care provider. This is important. Contact a health care provider if:  Your seizure pattern changes.  You have symptoms of infection or another illness. This might increase your risk of having a seizure. Get help right away if:  You have a seizure that does not stop after 5 minutes.  You have several seizures in a row without a complete recovery in between seizures.  You have a seizure that makes it harder to breathe.  You have a seizure that is different from previous seizures.  You have a seizure that leaves you unable to speak or use a part of your body.  You did not wake up immediately after a seizure. This information is not intended to replace advice given to you by your health care provider. Make sure you discuss any questions you have with your health care provider. Document Released: 08/19/2005 Document Revised: 03/16/2016 Document Reviewed: 02/27/2016 Elsevier Interactive Patient Education  2018 [employer].  Levetiracetam extended-release tablets What is this medicine? LEVETIRACETAM (lee ve tye RA se tam) is an  antiepileptic drug. It is used with other medicines to treat certain types of seizures. This medicine may be used for other purposes; ask your health care provider or pharmacist if you have questions. COMMON BRAND NAME(S): Keppra XR What should I tell my health care provider before I take this medicine? They need to know if you have any of these conditions: -kidney disease -suicidal thoughts, plans, or attempt; a previous suicide attempt by you or a family member -an unusual or allergic reaction to levetiracetam, other medicines, foods, dyes, or preservatives -pregnant or trying to get pregnant -breast-feeding How should I use this medicine? Take this medicine by mouth with a glass of water. Follow the directions on the prescription label. Do not cut, crush or chew this medicine. You may take this medicine with or without food. Take your doses at regular intervals. Do not take your medicine more often than directed. Do not stop taking this medicine or any of your seizure medicines unless instructed by your doctor or health care professional. Stopping your medicine suddenly can increase your seizures or their severity. A special MedGuide will be given to you by the pharmacist with each prescription and refill. Be sure to read this information carefully each time.  Contact your pediatrician or health care professional regarding the use of this medication in children. While this drug may be prescribed for children as young as 25 years of age for selected conditions, precautions do apply. Overdosage: If you think you have taken too much of this medicine contact a poison control center or emergency room at once. NOTE: This medicine is only for you. Do not share this medicine with others. What if I miss a dose? If you miss a dose and it has only been a few hours, take it as soon as you can. If it is almost time for your next dose, take only that dose. Do not take double or extra doses. What may interact  with this medicine? This medicine may interact with the following medications: -carbamazepine -colesevelam -probenecid -sevelamer This list may not describe all possible interactions. Give your health care provider a list of all the medicines, herbs, non-prescription drugs, or dietary supplements you use. Also tell them if you smoke, drink alcohol, or use illegal drugs. Some items may interact with your medicine. What should I watch for while using this medicine? Visit your doctor or health care professional for a regular check on your progress. Wear a medical identification bracelet or chain to say you have epilepsy, and carry a card that lists all your medications. It is important to take this medicine exactly as instructed by your health care professional. When first starting treatment, your dose may need to be adjusted. It may take weeks or months before your dose is stable. You should contact your doctor or health care professional if your seizures get worse or if you have any new types of seizures. You may get drowsy or dizzy. Do not drive, use machinery, or do anything that needs mental alertness until you know how this medicine affects you. Do not stand or sit up quickly, especially if you are an older patient. This reduces the risk of dizzy or fainting spells. Alcohol may interfere with the effect of this medicine. Avoid alcoholic drinks. The use of this medicine may increase the chance of suicidal thoughts or actions. Pay special attention to how you are responding while on this medicine. Any worsening of mood, or thoughts of suicide or dying should be reported to your health care professional right away. The tablet shell for some brands of this medicine does not dissolve. This is normal. The tablet shell may appear in the stool. This is not cause for concern. Women who become pregnant while using this medicine may enroll in the [place] [employer] Antiepileptic Drug Pregnancy Registry by calling  [phone]. This registry collects information about the safety of antiepileptic drug use during pregnancy. What side effects may I notice from receiving this medicine? Side effects that you should report to your doctor or health care professional as soon as possible: -allergic reactions like skin rash, itching or hives, swelling of the face, lips, or tongue -breathing problems -changes in emotions or moods -dark urine -general ill feeling or flu-like symptoms -problems with balance, talking, walking -suicidal thoughts or actions -unusually weak or tired -yellowing of the eyes or skin Side effects that usually do not require medical attention (report to your doctor or health care professional if they continue or are bothersome): -diarrhea -dizzy, drowsy -headache -loss of appetite This list may not describe all possible side effects. Call your doctor for medical advice about side effects. You may report side effects to FDA at 1-800-FDA-1088. Where should I keep my medicine? Keep out of  reach of children. Store at room temperature between 15 and 30 degrees C (59 and 86 degrees F). Throw away any unused medicine after the expiration date. NOTE: This sheet is a summary. It may not cover all possible information. If you have questions about this medicine, talk to your doctor, pharmacist, or health care provider.  2018 Elsevier/Gold Standard (2015-09-21 09:48:52)

## 2017-08-06 NOTE — Progress Notes (Signed)
[facility]   Provider:  Dr Lucia GaskinsAhern Referring Provider: [name], [name], MD Primary Care Physician:  [name], [name], MD  CC: seizure  Interval history 08/06/2017: Breakthrough seizures in the setting of stress. She has problems at work.  She denies missing meds, new meds, illness or any other provoking factor.  Interval history 12/25/2016: Patient is here for follow up of GTCS with corresponding EEG findings.. Reviewed images with patient and MRI findings (agree with below). She is on Keppra 500mg  twice daily. The medication is fine, no side effects and no seizures. 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 Will switch to Keppra XR once daily.   IMPRESSION: This MRI of the brain with and without contrast with adequate attention to the temporal lobes shows the following: 1. Several punctate T2/FLAIR hyperintense foci in the deep white matter of both hemispheres consistent with chronic microvascular ischemic change. None of the foci appears to be acute. 2. Very mild mucoperiosteal thickening in the right greater than left maxillary sinuses. 3. There is a "empty sella". This is a nonspecific finding and could be idiopathic. Elevated intracranial pressure can also cause this and further clinical correlation is suggested. 4. The structures of the mesial temporal lobes appear normal and symmetric.   Interval history 03/28/2016:Patient's here with husband. Patient had another episode of convulsions and was seen at the emergency room and started on Keppra. Had a long discussion about her recent EEG that showed evidence of generalized discharges. Advised patient to stay on antiepileptic medications. Discussed seizures, she has her MRI scheduled soon. I recommend she stay on her antiepileptic medications. They've been causing her a lot of sedation and fatigue but she is feeling better. Discussed other possible medications we could use,  she prefers to stay on Keppra over the next few weeks and see if side effects improve otherwise she will return so we can change her medications. Discussed seizures with her fiance as well. Discussed risks of seizures, advise compliance with medications as missing even one pill can cause of breakthrough seizure. She cannot drive or operate heavy machinery for 6 months. Discussed sudep.   CONCLUSION: This is an abnormal awake EEG. There is electrodiagnostic evidence of generalized epileptiform discharge, especially during hyperventilation and shortly following hyperventilation, consistent with generalized epilepsy disorder. She is having side effects to the keppra. She is very dizzy. She is getting used to the medication. She is compliant with it twice a day.   UXL:KGMWNUUVOZHPI:Kirsten N Fosteris a 46 y.o.femalehere as a referral from the emergency room for seizure. She has a past medical history of asthma, hypertension, diabetes, anxiety, depression, high cholesterol. She woke up on the 14th of this month and her fiance was very worried, in her face, he mouth hurt very bad, he said she was convulsing, she bit her tongue in 4 different spots. Her tongue was cut open. She was drooling. She would not wake up, she was very tired. She remembers the emergency room. She denies any other associated symptoms. No Hx of seizures or FHx of seizures. She was attacked by a swarm of bees a week previous otherwise no inciting events or previous illnesses or head trauma. .A few months ago she woke up with a bitten tongue as well. Never had alteration of consciousness, or any other strange episodes in the past. Never urinated on herself. The episode lasted about 30 seconds. She denied any alcohol or drug use.denied any other focal neurologic deficits or symptoms.  Reviewed notes, labs and imaging from outside physicians, which showed:  CT of the head (personally reviewed and agree with the following): The ventricles are  normal in size and configuration. There is invagination of CSF into the sella, consistent with a degree of empty sella. There is no intracranial mass, hemorrhage, extra-axial fluid collection, or midline shift. Gray-white compartments are normal. No acute infarct evident. The bony calvarium appears intact. The mastoid air cells are clear. Orbits appear symmetric and unremarkable bilaterally.  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 The ventricles are normal in size and configuration. There is no intracranial mass, hemorrhage, or focal gray -white compartment lesion.  BMP in the hospital was abnormal with potassium of 3,CBC was essentially normal     Review of Systems: Patient complains of symptoms per HPI as well as the following symptoms: seizure. Pertinent negatives and positives per HPI. All others negative.   Social History   Socioeconomic History  . Marital status: Single    Spouse name: Not on file  . Number of children: 4  . Years of education: 80  . Highest education level: Not on file  Social Needs  . Financial resource strain: Not on file  . Food insecurity - worry: Not on file  . Food insecurity - inability: Not on file  . Transportation needs - medical: Not on file  . Transportation needs - non-medical: Not on file  Occupational History  . Occupation: Ship broker  Tobacco Use  . Smoking status: Never Smoker  . Smokeless tobacco: Never Used  Substance and Sexual Activity  . Alcohol use: No  . Drug use: No  . Sexual activity: Not on file  Other Topics Concern  . Not on file  Social History Narrative   Lives with fiance   Caffeine use: 1 cup of coffee every other day    Family History  Problem Relation Age of Onset  . Cancer Unknown   . Diabetes Unknown   . Seizures Neg Hx     Past Medical History:  Diagnosis Date  . Anxiety   . Asthma   . Depression   . Diabetes (HCC)   . Headache   . High cholesterol   . Hypertension   . Seizure (HCC)    . Stroke (cerebrum) (HCC) 2014    Past Surgical History:  Procedure Laterality Date  . TUBAL LIGATION  1994    Current Outpatient Medications  Medication Sig Dispense Refill  . aspirin 325 MG EC tablet Take 325 mg by mouth daily.    . Aspirin-Salicylamide-Caffeine (BC HEADACHE PO) Take 1 Package by mouth daily as needed (pain).    . celecoxib (CELEBREX) 200 MG capsule Take 200 mg by mouth daily.     . cetirizine (ZYRTEC) 10 MG tablet Take 10 mg by mouth daily as needed for allergies.    . hydrochlorothiazide (HYDRODIURIL) 25 MG tablet Take 25 mg by mouth daily.     Marland Kitchen omeprazole (PRILOSEC) 20 MG capsule Take 20 mg daily by mouth.     . sertraline (ZOLOFT) 100 MG tablet Take 100 mg by mouth daily.     Marland Kitchen levETIRAcetam (KEPPRA XR) 500 MG 24 hr tablet Take 3 tablets (1,500 mg total) by mouth daily. 90 tablet 11   No current facility-administered medications for this visit.     Allergies as of 08/06/2017 - Review Complete 08/06/2017  Allergen Reaction Noted  . Gadolinium derivatives Hives 03/28/2016    Vitals: BP 121/79 (BP Location: Right Arm,  Patient Position: Sitting)   Pulse 79   Ht 5\' 5"  (1.651 m)   Wt 260 lb 3.2 oz (118 kg)   BMI 43.30 kg/m  Last Weight:  Wt Readings from Last 1 Encounters:  08/06/17 260 lb 3.2 oz (118 kg)   Last Height:   Ht Readings from Last 1 Encounters:  08/06/17 5\' 5"  (1.651 m)    Physical exam: Exam: Gen: NAD, conversant, well nourised, obese, well groomed                     CV: RRR, no MRG. No Carotid Bruits. No peripheral edema, warm, nontender Eyes: Conjunctivae clear without exudates or hemorrhage  Neuro: Detailed Neurologic Exam  Speech:    Speech is normal; fluent and spontaneous with normal comprehension.  Cognition:    The patient is oriented to person, place, and time;  Cranial Nerves:    The pupils are equal, round, and reactive to light.  Visual fields are full to finger confrontation. Extraocular movements are intact.  Trigeminal sensation is intact and the muscles of mastication are normal. The face is symmetric. The palate elevates in the midline. Hearing intact. Voice is normal. Shoulder shrug is normal. The tongue has normal motion without fasciculations.    Strength:    Strength is V/V in the upper and lower limbs.         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 Changed Keppra to XR due to missing her second dose often due to shift work. EEG was abnormal.  Breakthrough seizure in the setting of stress.   - She is doing better on XR once daily dosing, says she is compliant (was not compliant with BID dosing). Increase Keppra XR to 1500mg  daily  Patient is unable to drive, operate heavy machinery, perform activities at heights or participate in water activities until 6 months seizure free  Discussed Patients with epilepsy have a small risk of sudden unexpected death, a condition referred to as sudden unexpected death in epilepsy (SUDEP). SUDEP is defined specifically as the sudden, unexpected, witnessed or unwitnessed, nontraumatic and nondrowning death in patients with epilepsy with or without evidence for a seizure, and excluding documented status epilepticus, in which post mortem examination does not reveal a structural or toxicologic cause for death     Naomie DeanAntonia Torrey Ballinas, MD  [facility] [address] [place], [place] [id number]  Phone [phone] Fax [phone]  A total of 15 minutes was spent face-to-face with this patient. Over half this time was spent on counseling patient on the seizure diagnosis and different diagnostic and therapeutic options available.

## 2017-08-12 ENCOUNTER — Other Ambulatory Visit: Payer: Self-pay

## 2017-08-12 ENCOUNTER — Encounter (HOSPITAL_COMMUNITY): Payer: Self-pay | Admitting: Emergency Medicine

## 2017-08-12 DIAGNOSIS — E7800 Pure hypercholesterolemia, unspecified: Secondary | ICD-10-CM | POA: Diagnosis not present

## 2017-08-12 DIAGNOSIS — M545 Low back pain: Secondary | ICD-10-CM | POA: Insufficient documentation

## 2017-08-12 DIAGNOSIS — I10 Essential (primary) hypertension: Secondary | ICD-10-CM | POA: Diagnosis not present

## 2017-08-12 DIAGNOSIS — E119 Type 2 diabetes mellitus without complications: Secondary | ICD-10-CM | POA: Insufficient documentation

## 2017-08-12 DIAGNOSIS — Z8673 Personal history of transient ischemic attack (TIA), and cerebral infarction without residual deficits: Secondary | ICD-10-CM | POA: Insufficient documentation

## 2017-08-12 DIAGNOSIS — J45909 Unspecified asthma, uncomplicated: Secondary | ICD-10-CM | POA: Insufficient documentation

## 2017-08-12 DIAGNOSIS — Z7982 Long term (current) use of aspirin: Secondary | ICD-10-CM | POA: Diagnosis not present

## 2017-08-12 DIAGNOSIS — Z79899 Other long term (current) drug therapy: Secondary | ICD-10-CM | POA: Insufficient documentation

## 2017-08-12 MED ORDER — OXYCODONE-ACETAMINOPHEN 5-325 MG PO TABS
1.0000 | ORAL_TABLET | Freq: Once | ORAL | Status: AC
  Administered 2017-08-12: 1 via ORAL
  Filled 2017-08-12: qty 1

## 2017-08-12 NOTE — ED Notes (Signed)
Pt given narcotic pain medicine in triage. Advised of side effects and instructed to avoid driving for a minimum of four hours.  

## 2017-08-12 NOTE — ED Triage Notes (Addendum)
Pt reports increased back pain over the last 2-3 days, states hx of injury 4-5 years ago. States usually has pain in her lower back, now having pain in mid back. States numbness in hands depending on how she sits. States stabbing pain that is impacting her ability to rest. Unable to report heavy lifting or recent falls. States this visit is to be filed under worker's comp

## 2017-08-13 ENCOUNTER — Emergency Department (HOSPITAL_COMMUNITY)
Admission: EM | Admit: 2017-08-13 | Discharge: 2017-08-13 | Disposition: A | Discharge status: Home / Self Care | Payer: Worker's Compensation | Attending: Emergency Medicine | Admitting: Emergency Medicine

## 2017-08-13 DIAGNOSIS — M545 Low back pain, unspecified: Secondary | ICD-10-CM

## 2017-08-13 DIAGNOSIS — G8929 Other chronic pain: Secondary | ICD-10-CM

## 2017-08-13 MED ORDER — NAPROXEN 250 MG PO TABS: 500.0000 mg | ORAL_TABLET | Freq: Once | ORAL | Status: DC

## 2017-08-13 MED ORDER — ORPHENADRINE CITRATE ER 100 MG PO TB12: 100.0000 mg | ORAL_TABLET | Freq: Two times a day (BID) | ORAL | 0 refills | Status: DC

## 2017-08-13 MED ORDER — OXYCODONE-ACETAMINOPHEN 5-325 MG PO TABS: 1.0000 | ORAL_TABLET | ORAL | 0 refills | Status: DC | PRN

## 2017-08-13 MED ORDER — OXYCODONE-ACETAMINOPHEN 5-325 MG PO TABS: 1.0000 | ORAL_TABLET | Freq: Once | ORAL | Status: DC

## 2017-08-13 MED ORDER — NAPROXEN 500 MG PO TABS: 500.0000 mg | ORAL_TABLET | Freq: Two times a day (BID) | ORAL | 0 refills | Status: DC

## 2017-08-13 MED ORDER — CYCLOBENZAPRINE HCL 10 MG PO TABS: 10.0000 mg | ORAL_TABLET | Freq: Once | ORAL | Status: DC

## 2017-08-13 NOTE — Discharge Instructions (Signed)
Apply ice several times a day. °

## 2017-08-13 NOTE — ED Provider Notes (Signed)
[facility] EMERGENCY DEPARTMENT Provider Note   CSN: [id number] Arrival date & time: 08/12/17  2104     History   Chief Complaint Chief Complaint  Patient presents with  . Back Pain    HPI Kirsten Huang is a 46 y.o. female.  The history is provided by the patient.  She has history of seizures, chronic back pain, hypertension, hyperlipidemia, diabetes, stroke.  For the last 4 days, she has had worsening of her chronic back pain.  She relates that she had injured her back at work many years ago and the pain periodically flares up.  She denies any recent trauma or unusual activity to precipitate this.  She rates pain at 10/10.  She normally takes [facility] powder for relief, but it was not helping her this time.  She received a dose of oxycodone-acetaminophen in triage and that helped her pain considerably.  She had been on an anti-inflammatory medication, but ran out about 5 months ago.  She also has not been seeing her pain management physician and states that she is now being directed by Workmen's Comp. to change physicians.  While in the waiting room, her right leg got numb.  This is something that has been happening intermittently for a long time.  It occurs mainly when she is sitting for an extended period.  She also had an episode in the waiting room where her arms went numb, but this has resolved completely.  She denies any bowel or bladder dysfunction.  Past Medical History:  Diagnosis Date  . Anxiety   . Asthma   . Depression   . Diabetes (HCC)   . Headache   . High cholesterol   . Hypertension   . Seizure (HCC)   . Stroke (cerebrum) [facility]) 2014    Patient Active Problem List   Diagnosis Date Noted  . 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 Airway hyperreactivity 02/27/2016  . Back ache 02/27/2016  . Seizure (HCC) 02/22/2016  . Seizures (HCC) 02/20/2016  . Abnormal finding on thyroid function test 10/18/2015  . Borderline diabetes  mellitus 07/14/2015  . Blush 07/14/2015  . Other specified symptoms and signs involving the digestive system and abdomen 07/14/2015  . Recurrent major depressive disorder, in partial remission (HCC) 06/16/2015  . Avitaminosis D 11/10/2014  . Temporary cerebral vascular dysfunction 06/01/2014  . Morbid (severe) obesity due to excess calories (HCC) 01/17/2014  . Benign essential HTN 01/17/2014  . HLD (hyperlipidemia) 05/28/2013    Past Surgical History:  Procedure Laterality Date  . TUBAL LIGATION  1994    OB History    No data available       Home Medications    Prior to Admission medications   Medication Sig Start Date End Date Taking? Authorizing Provider  aspirin 325 MG EC tablet Take 325 mg by mouth daily.    [provider]  Aspirin-Salicylamide-Caffeine (BC HEADACHE PO) Take 1 Package by mouth daily as needed (pain).    [provider]  celecoxib (CELEBREX) 200 MG capsule Take 200 mg by mouth daily.  01/30/16   [provider]  cetirizine (ZYRTEC) 10 MG tablet Take 10 mg by mouth daily as needed for allergies.    [provider]  hydrochlorothiazide (HYDRODIURIL) 25 MG tablet Take 25 mg by mouth daily.  12/01/15   [provider]  levETIRAcetam (KEPPRA XR) 500 MG 24 hr tablet Take 3 tablets (1,500 mg total) by mouth daily. 08/06/17   Anson Fret,  MD  omeprazole (PRILOSEC) 20 MG capsule Take 20 mg daily by mouth.  12/20/16   [provider]  sertraline (ZOLOFT) 100 MG tablet Take 100 mg by mouth daily.  01/30/16   [provider]    Family History Family History  Problem Relation Age of Onset  . Cancer Unknown   . Diabetes Unknown   . Seizures Neg Hx     Social History Social History   Tobacco Use  . Smoking status: Never Smoker  . Smokeless tobacco: Never Used  Substance Use Topics  . Alcohol use: No  . Drug use: No     Allergies   Gadolinium derivatives   Review of Systems Review of  Systems  All other systems reviewed and are negative.    Physical Exam Updated Vital Signs BP 117/76 (BP Location: Right Arm)   Pulse (!) 59   Temp 99.1 F (37.3 C) (Oral)   Resp 17   SpO2 98%   Physical Exam  Nursing note and vitals reviewed.  46 year old female, resting comfortably and in no acute distress. Vital signs are normal. Oxygen saturation is 98%, which is normal. Head is normocephalic and atraumatic. PERRLA, EOMI. Oropharynx is clear. Neck is nontender and supple without adenopathy or JVD. Back is has moderate tenderness at the mid and upper lumbar areas with bilateral paralumbar spasm.  There is moderate left paralumbar tenderness.  Straight leg raise is positive bilaterally at 30 degrees.  There is no CVA tenderness. Lungs are clear without rales, wheezes, or rhonchi. Chest is nontender. Heart has regular rate and rhythm without murmur. Abdomen is soft, flat, nontender without masses or hepatosplenomegaly and peristalsis is normoactive. Extremities have no cyanosis or edema, full range of motion is present. Skin is warm and dry without rash. Neurologic: Mental status is normal, cranial nerves are intact, there are no motor or sensory deficits.  ED Treatments / Results   Procedures Procedures (including critical care time)  Medications Ordered in ED Medications  naproxen (NAPROSYN) tablet 500 mg (not administered)  oxyCODONE-acetaminophen (PERCOCET/ROXICET) 5-325 MG per tablet 1 tablet (not administered)  cyclobenzaprine (FLEXERIL) tablet 10 mg (not administered)  oxyCODONE-acetaminophen (PERCOCET/ROXICET) 5-325 MG per tablet 1 tablet (1 tablet Oral Given 08/12/17 2130)     Initial Impression / Assessment and Plan / ED Course  I have reviewed the triage vital signs and the nursing notes.  Exacerbation chronic low back pain.  No red flags to suggest more serious pathology.  No evidence of acute neurologic injury.  No indications for imaging.  Old records are  reviewed, and I see a CT myelogram done in 2001, but no recent visits for back pain.  I have reviewed her record [place] the [place] controlled substance reporting website, and she only has to short-term narcotic prescriptions in the last 2 years.  She is discharged with prescriptions for naproxen, orphenadrine, and oxycodone-acetaminophen.  She is referred back to her pain management physician.  Given work release for 24 hours.  Final Clinical Impressions(s) / ED Diagnoses   Final diagnoses:  Acute exacerbation of chronic low back pain    ED Discharge Orders        Ordered    naproxen (NAPROSYN) 500 MG tablet  2 times daily     08/13/17 0111    orphenadrine (NORFLEX) 100 MG tablet  2 times daily     08/13/17 0111    oxyCODONE-acetaminophen (PERCOCET) 5-325 MG tablet  Every 4 hours PRN  08/13/17 0111       Dione BoozeGlick, Shawnta Schlegel, MD 08/13/17 [id number]

## 2017-10-02 ENCOUNTER — Encounter: Payer: Self-pay | Admitting: Neurology

## 2017-11-24 ENCOUNTER — Encounter: Payer: Self-pay | Admitting: Neurology

## 2017-11-24 ENCOUNTER — Ambulatory Visit: Payer: BC Managed Care – PPO | Admitting: Neurology

## 2017-11-24 VITALS — BP 118/69 | HR 73 | Ht 65.0 in | Wt 263.0 lb

## 2017-11-24 DIAGNOSIS — G40309 Generalized idiopathic epilepsy and epileptic syndromes, not intractable, without status epilepticus: Secondary | ICD-10-CM | POA: Diagnosis not present

## 2017-11-24 DIAGNOSIS — M545 Low back pain: Secondary | ICD-10-CM | POA: Diagnosis not present

## 2017-11-24 DIAGNOSIS — G8929 Other chronic pain: Secondary | ICD-10-CM

## 2017-11-24 MED ORDER — LEVETIRACETAM ER 500 MG PO TB24: 1500.0000 mg | ORAL_TABLET | Freq: Every day | ORAL | 4 refills | Status: DC

## 2017-11-24 NOTE — Patient Instructions (Addendum)
Chronic Low back pain: Physical therapy and Dr. Norville [facility]ardHarkin and [facility]   Epilepsy Epilepsy is a condition in which a person has repeated seizures over time. A seizure is a sudden burst of abnormal electrical and chemical activity in the brain. Seizures can cause a change in attention, behavior, or the ability to remain awake and alert (altered mental status). Epilepsy increases a person's risk of falls, accidents, and injury. It can also lead to complications, including:  Depression.  Poor memory.  Sudden unexplained death in epilepsy (SUDEP). This complication is rare, and its cause is not known.  Most people with epilepsy lead normal lives. What are the causes? This condition may be caused by:  A head injury.  An injury that happens at birth.  A high fever during childhood.  A stroke.  Bleeding that goes into or around the brain.  Certain medicines and drugs.  Having too little oxygen for a long period of time.  Abnormal brain development.  Certain infections, such as meningitis and encephalitis.  Brain tumors.  Conditions that are passed along from parent to child (are hereditary).  What are the signs or symptoms? Symptoms of a seizure vary greatly from person to person. They include:  Convulsions.  Stiffening of the body.  Involuntary movements of the arms or legs.  Loss of consciousness.  Breathing problems.  Falling suddenly.  Confusion.  Head nodding.  Eye blinking or fluttering.  Lip smacking.  Drooling.  Rapid eye movements.  Grunting.  Loss of bladder control and bowel control.  Staring.  Unresponsiveness.  Some people have symptoms right before a seizure happens (aura) and right after a seizure happens. Symptoms of an aura include:  Fear or anxiety.  Nausea.  Feeling like the room is spinning (vertigo).  A feeling of having seen or heard something before (deja vu).  Odd tastes or smells.  Changes in vision,  such as seeing flashing lights or spots.  Symptoms that follow a seizure include:  Confusion.  Sleepiness.  Headache.  How is this diagnosed? This condition is diagnosed based on:  Your symptoms.  Your medical history.  A physical exam.  A neurological exam. A neurological exam is similar to a physical exam. It involves checking your strength, reflexes, coordination, and sensations.  Tests, such as: ? An electroencephalogram (EEG). This is a painless test that creates a diagram of your brain waves. ? An MRI of the brain. ? A CT scan of the brain. ? A lumbar puncture, also called a spinal tap. ? Blood tests to check for signs of infection or abnormal blood chemistry.  How is this treated? There is no cure for this condition, but treatment can help control seizures. Treatment may involve:  Taking medicines to control seizures. These include medicines to prevent seizures and medicines to stop seizures as they occur.  Having a device called a vagus nerve stimulator implanted in the chest. The device sends electrical impulses to the vagus nerve and to the brain to prevent seizures. This treatment may be recommended if medicines do not help.  Brain surgery. There are several kinds of surgeries that may be done to stop seizures from happening or to reduce how often seizures happen.  Having regular blood tests. You may need to have blood tests regularly to check that you are getting the right amount of medicine.  Once this condition has been diagnosed, it is important to begin treatment as soon as possible. For some people, epilepsy eventually goes away. Follow  these instructions at home: Medicines   Take over-the-counter and prescription medicines only as told by your health care provider.  Avoid any substances that may prevent your medicine from working properly, such as alcohol. Activity  Get enough rest. Lack of sleep can make seizures more likely to occur.  Follow  instructions from your health care provider about driving, swimming, and doing any other activities that would be dangerous if you had a seizure. Educating others Teach friends and family what to do if you have a seizure. They should:  Lay you on the ground to prevent a fall.  Cushion your head and body.  Loosen any tight clothing around your neck.  Turn you on your side. If vomiting occurs, this helps keep your airway clear.  Stay with you until you recover.  Not hold you down. Holding you down will not stop the seizure.  Not put anything in your mouth.  Know whether or not you need emergency care.  General instructions  Avoid anything that has ever triggered a seizure for you.  Keep a seizure diary. Record what you remember about each seizure, especially anything that might have triggered the seizure.  Keep all follow-up visits as told by your health care provider. This is important. Contact a health care provider if:  Your seizure pattern changes.  You have symptoms of infection or another illness. This might increase your risk of having a seizure. Get help right away if:  You have a seizure that does not stop after 5 minutes.  You have several seizures in a row without a complete recovery in between seizures.  You have a seizure that makes it harder to breathe.  You have a seizure that is different from previous seizures.  You have a seizure that leaves you unable to speak or use a part of your body.  You did not wake up immediately after a seizure. This information is not intended to replace advice given to you by your health care provider. Make sure you discuss any questions you have with your health care provider. Document Released: 08/19/2005 Document Revised: 03/16/2016 Document Reviewed: 02/27/2016 Elsevier Interactive Patient Education  [employer].

## 2017-11-24 NOTE — Addendum Note (Signed)
Addended by: Naomie DeanAHERN, Vallie Teters B on: 11/24/2017 10:23 AM   Modules accepted: Orders

## 2017-11-24 NOTE — Progress Notes (Addendum)
[facility]   Provider:  Dr Lucia Gaskins Referring Provider: Macy Mis, MD Primary Care Physician:  Macy Mis, MD  CC: seizure  Interval history 11/24/2017: Patient here for follow up of seizures. PMHx DM, HLD, HTN, epilepsy. We last saw her 4 months ago, we increased her medications to 1500mg  daily on Keppra once daily as she was not compliant with twice a day. Lots of stress at work with her job but things are better now. She is compliant with her once daily dosing.She is now working in [employer] and loving her job. Sheis on 3rd shift 7-on and 7-off. She is doing well with medication, no side effects, no seizures. Last seizure 07/17/2017. So she can drive in May. She has broken up with her partner due to infidelity.   Interval history 08/06/2017: Breakthrough seizures in the setting of stress. She has problems at work.  She denies missing meds, new meds, illness or any other provoking factor.  Interval history 12/25/2016: Patient is here for follow up of GTCS with corresponding EEG findings.. Reviewed images with patient and MRI findings (agree with below). She is on Keppra 500mg  twice daily. The medication is fine, no side effects and no seizures. 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 Will switch to Keppra XR once daily.   IMPRESSION: This MRI of the brain with and without contrast with adequate attention to the temporal lobes shows the following: 1. Several punctate T2/FLAIR hyperintense foci in the deep white matter of both hemispheres consistent with chronic microvascular ischemic change. None of the foci appears to be acute. 2. Very mild mucoperiosteal thickening in the right greater than left maxillary sinuses. 3. There is a "empty sella". This is a nonspecific finding and could be idiopathic. Elevated intracranial pressure can also cause this and further clinical correlation is suggested. 4. The structures of the  mesial temporal lobes appear normal and symmetric.   Interval history 03/28/2016:Patient's here with husband. Patient had another episode of convulsions and was seen at the emergency room and started on Keppra. Had a long discussion about her recent EEG that showed evidence of generalized discharges. Advised patient to stay on antiepileptic medications. Discussed seizures, she has her MRI scheduled soon. I recommend she stay on her antiepileptic medications. They've been causing her a lot of sedation and fatigue but she is feeling better. Discussed other possible medications we could use, she prefers to stay on Keppra over the next few weeks and see if side effects improve otherwise she will return so we can change her medications. Discussed seizures with her fiance as well. Discussed risks of seizures, advise compliance with medications as missing even one pill can cause of breakthrough seizure. She cannot drive or operate heavy machinery for 6 months. Discussed sudep.   CONCLUSION: This is an abnormal awake EEG. There is electrodiagnostic evidence of generalized epileptiform discharge, especially during hyperventilation and shortly following hyperventilation, consistent with generalized epilepsy disorder. She is having side effects to the keppra. She is very dizzy. She is getting used to the medication. She is compliant with it twice a day.   VWU:JWJXBJYNWG N Fosteris a 47 y.o.femalehere as a referral from the emergency room for seizure. She has a past medical history of asthma, hypertension, diabetes, anxiety, depression, high cholesterol. She woke up on the 14th of this month and her fiance was very worried, in her face, he mouth hurt very bad, he said she was convulsing, she bit her  tongue in 4 different spots. Her tongue was cut open. She was drooling. She would not wake up, she was very tired. She remembers the emergency room. She denies any other associated symptoms. No Hx of seizures or  FHx of seizures. She was attacked by a swarm of bees a week previous otherwise no inciting events or previous illnesses or head trauma. .A few months ago she woke up with a bitten tongue as well. Never had alteration of consciousness, or any other strange episodes in the past. Never urinated on herself. The episode lasted about 30 seconds. She denied any alcohol or drug use.denied any other focal neurologic deficits or symptoms.  Reviewed notes, labs and imaging from outside physicians, which showed:  CT of the head (personally reviewed and agree with the following): The ventricles are normal in size and configuration. There is invagination of CSF into the sella, consistent with a degree of empty sella. There is no intracranial mass, hemorrhage, extra-axial fluid collection, or midline shift. Gray-white compartments are normal. No acute infarct evident. The bony calvarium appears intact. The mastoid air cells are clear. Orbits appear symmetric and unremarkable bilaterally.  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 The ventricles are normal in size and configuration. There is no intracranial mass, hemorrhage, or focal gray -white compartment lesion.  BMP in the hospital was abnormal with potassium of 3,CBC was essentially normal     Review of Systems: Patient complains of symptoms per HPI as well as the following symptoms: seizure. Pertinent negatives and positives per HPI. All others negative.   Social History   Socioeconomic History  . Marital status: Single    Spouse name: Not on file  . Number of children: 4  . Years of education: 61  . Highest education level: Not on file  Occupational History  . Occupation: Ship broker  Social Needs  . Financial resource strain: Not on file  . Food insecurity:    Worry: Not on file    Inability: Not on file  . Transportation needs:    Medical: Not on file    Non-medical: Not on file  Tobacco Use  . Smoking status: Never Smoker    . Smokeless tobacco: Never Used  Substance and Sexual Activity  . Alcohol use: No  . Drug use: No  . Sexual activity: Not on file  Lifestyle  . Physical activity:    Days per week: Not on file    Minutes per session: Not on file  . Stress: Not on file  Relationships  . Social connections:    Talks on phone: Not on file    Gets together: Not on file    Attends religious service: Not on file    Active member of club or organization: Not on file    Attends meetings of clubs or organizations: Not on file    Relationship status: Not on file  . Intimate partner violence:    Fear of current or ex partner: Not on file    Emotionally abused: Not on file    Physically abused: Not on file    Forced sexual activity: Not on file  Other Topics Concern  . Not on file  Social History Narrative   Lives with friend   Caffeine use: 1 cup of coffee every other day    Family History  Problem Relation Age of Onset  . Cancer Unknown   . Diabetes Unknown   . Seizures Neg Hx     Past Medical  History:  Diagnosis Date  . Anxiety   . Asthma   . Chlamydia   . Depression   . Diabetes (HCC)   . Headache   . High cholesterol   . Hypertension   . Seizure (HCC)   . Stroke (cerebrum) (HCC) 2014    Past Surgical History:  Procedure Laterality Date  . TUBAL LIGATION  1994    Current Outpatient Medications  Medication Sig Dispense Refill  . aspirin 325 MG EC tablet Take 325 mg by mouth daily.    . Aspirin-Salicylamide-Caffeine (BC HEADACHE PO) Take 1 Package by mouth daily as needed (pain).    Marland Kitchen. atorvastatin (LIPITOR) 10 MG tablet Take 10 mg by mouth daily.  3  . cetirizine (ZYRTEC) 10 MG tablet Take 10 mg by mouth daily as needed for allergies.    . hydrochlorothiazide (HYDRODIURIL) 25 MG tablet Take 25 mg by mouth daily.     Marland Kitchen. levETIRAcetam (KEPPRA XR) 500 MG 24 hr tablet Take 3 tablets (1,500 mg total) by mouth daily. 270 tablet 4  . omeprazole (PRILOSEC) 20 MG capsule Take 20 mg daily  by mouth.     Marland Kitchen. SAXENDA 18 MG/3ML SOPN Inject 0.3 mLs into the skin daily.   1  . sertraline (ZOLOFT) 100 MG tablet Take 100 mg by mouth daily.      No current facility-administered medications for this visit.     Allergies as of 11/24/2017 - Review Complete 11/24/2017  Allergen Reaction Noted  . Gadolinium derivatives Hives 03/28/2016    Vitals: BP 118/69 (BP Location: Right Arm, Patient Position: Sitting)   Pulse 73   Ht 5\' 5"  (1.651 m)   Wt 263 lb (119.3 kg)   BMI 43.77 kg/m  Last Weight:  Wt Readings from Last 1 Encounters:  11/24/17 263 lb (119.3 kg)   Last Height:   Ht Readings from Last 1 Encounters:  11/24/17 5\' 5"  (1.651 m)    Physical exam: Exam: Gen: NAD, conversant, well nourised, obese, well groomed                     CV: RRR, no MRG. No Carotid Bruits. No peripheral edema, warm, nontender Eyes: Conjunctivae clear without exudates or hemorrhage  Neuro: Detailed Neurologic Exam  Speech:    Speech is normal; fluent and spontaneous with normal comprehension.  Cognition:    The patient is oriented to person, place, and time;  Cranial Nerves:    The pupils are equal, round, and reactive to light.  Visual fields are full to finger confrontation. Extraocular movements are intact. Trigeminal sensation is intact and the muscles of mastication are normal. The face is symmetric. The palate elevates in the midline. Hearing intact. Voice is normal. Shoulder shrug is normal. The tongue has normal motion without fasciculations.    Strength:    Strength is V/V in the upper and lower limbs.         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 Changed Keppra to XR due to missing her second dose often due to shift work. EEG was abnormal.  Breakthrough seizure in the setting of stress an noncompliance due to shift work but since changing to once daily dosing she is well controlled, compliant, no seizures, doing well. .   - She is doing better on XR once daily  dosing, says she is compliant (was not compliant with BID dosing). Conitnue Keppra XR to 1500mg  daily  - Patient is unable to drive, operate heavy  machinery, perform activities at heights or participate in water activities until 6 months seizure free which is in May of this year  - Discussed Patients with epilepsy have a small risk of sudden unexpected death, a condition referred to as sudden unexpected death in epilepsy (SUDEP). SUDEP is defined specifically as the sudden, unexpected, witnessed or unwitnessed, nontraumatic and nondrowning death in patients with epilepsy with or without evidence for a seizure, and excluding documented status epilepticus, in which post mortem examination does not reveal a structural or toxicologic cause for death   - Discussed stress, sleep, compliance and other exacerbating factors for seizure breakthrough and medications that can lower the seizure threshold.  - Seizure precautions  - Chronic back pain: Physical therapy and refer to Dr. Ollen Bowl for pain management/ESI or other pain management techniques  Cc: Dr. Earnest Bailey  Orders Placed This Encounter  Procedures  . Ambulatory referral to Physical Therapy  . Ambulatory referral to Neurosurgery     Naomie Dean, MD  [facility] [address] [place], [place] [address]  Phone [phone] Fax [phone]  A total of 25 minutes was spent face-to-face with this patient. Over half this time was spent on counseling patient on the seizure diagnosis and different diagnostic and therapeutic options available.

## 2017-12-25 ENCOUNTER — Ambulatory Visit: Payer: BC Managed Care – PPO | Admitting: Neurology

## 2018-08-25 ENCOUNTER — Emergency Department (HOSPITAL_BASED_OUTPATIENT_CLINIC_OR_DEPARTMENT_OTHER)
Admission: EM | Admit: 2018-08-25 | Discharge: 2018-08-25 | Disposition: A | Discharge status: Home / Self Care | Payer: BC Managed Care – PPO | Attending: Emergency Medicine | Admitting: Emergency Medicine

## 2018-08-25 ENCOUNTER — Encounter (HOSPITAL_BASED_OUTPATIENT_CLINIC_OR_DEPARTMENT_OTHER): Payer: Self-pay | Admitting: Emergency Medicine

## 2018-08-25 ENCOUNTER — Emergency Department (HOSPITAL_BASED_OUTPATIENT_CLINIC_OR_DEPARTMENT_OTHER): Payer: BC Managed Care – PPO

## 2018-08-25 ENCOUNTER — Other Ambulatory Visit: Payer: Self-pay

## 2018-08-25 DIAGNOSIS — W19XXXA Unspecified fall, initial encounter: Secondary | ICD-10-CM

## 2018-08-25 DIAGNOSIS — I10 Essential (primary) hypertension: Secondary | ICD-10-CM | POA: Insufficient documentation

## 2018-08-25 DIAGNOSIS — W010XXA Fall on same level from slipping, tripping and stumbling without subsequent striking against object, initial encounter: Secondary | ICD-10-CM | POA: Diagnosis not present

## 2018-08-25 DIAGNOSIS — Y9389 Activity, other specified: Secondary | ICD-10-CM | POA: Insufficient documentation

## 2018-08-25 DIAGNOSIS — Z79899 Other long term (current) drug therapy: Secondary | ICD-10-CM | POA: Insufficient documentation

## 2018-08-25 DIAGNOSIS — S8001XA Contusion of right knee, initial encounter: Secondary | ICD-10-CM | POA: Insufficient documentation

## 2018-08-25 DIAGNOSIS — Z7982 Long term (current) use of aspirin: Secondary | ICD-10-CM | POA: Insufficient documentation

## 2018-08-25 DIAGNOSIS — J45909 Unspecified asthma, uncomplicated: Secondary | ICD-10-CM | POA: Diagnosis not present

## 2018-08-25 DIAGNOSIS — S60222A Contusion of left hand, initial encounter: Secondary | ICD-10-CM | POA: Diagnosis not present

## 2018-08-25 DIAGNOSIS — E119 Type 2 diabetes mellitus without complications: Secondary | ICD-10-CM | POA: Diagnosis not present

## 2018-08-25 DIAGNOSIS — Y92512 Supermarket, store or market as the place of occurrence of the external cause: Secondary | ICD-10-CM | POA: Insufficient documentation

## 2018-08-25 DIAGNOSIS — F419 Anxiety disorder, unspecified: Secondary | ICD-10-CM | POA: Diagnosis not present

## 2018-08-25 DIAGNOSIS — Y998 Other external cause status: Secondary | ICD-10-CM | POA: Diagnosis not present

## 2018-08-25 DIAGNOSIS — S60221A Contusion of right hand, initial encounter: Secondary | ICD-10-CM | POA: Diagnosis not present

## 2018-08-25 DIAGNOSIS — Z8673 Personal history of transient ischemic attack (TIA), and cerebral infarction without residual deficits: Secondary | ICD-10-CM | POA: Diagnosis not present

## 2018-08-25 DIAGNOSIS — Z7984 Long term (current) use of oral hypoglycemic drugs: Secondary | ICD-10-CM | POA: Diagnosis not present

## 2018-08-25 DIAGNOSIS — S8991XA Unspecified injury of right lower leg, initial encounter: Secondary | ICD-10-CM | POA: Diagnosis present

## 2018-08-25 DIAGNOSIS — F329 Major depressive disorder, single episode, unspecified: Secondary | ICD-10-CM | POA: Insufficient documentation

## 2018-08-25 MED ORDER — SODIUM CHLORIDE 0.9 % IV SOLN: INTRAVENOUS | Status: DC

## 2018-08-25 MED ORDER — ACETAMINOPHEN 500 MG PO TABS
1000.0000 mg | ORAL_TABLET | Freq: Once | ORAL | Status: AC
  Administered 2018-08-25: 1000 mg via ORAL
  Filled 2018-08-25: qty 2

## 2018-08-25 MED ORDER — PIPERACILLIN-TAZOBACTAM 3.375 G IVPB 30 MIN
3.3750 g | Freq: Once | INTRAVENOUS | Status: DC
  Filled 2018-08-25: qty 50

## 2018-08-25 NOTE — ED Triage Notes (Addendum)
Patient states that she fell last night and hurt her bilateral hands as well as her right hip and lower back. The patient states that she has a generalized aches all over

## 2018-08-25 NOTE — Discharge Instructions (Signed)
You can take Tylenol 650 mg every 4 hours as needed for pain.  Place an ice pack on painful areas 4 times daily for 30 minutes at a time.  If you have significant pain by next week see your primary care physician or an urgent care center

## 2018-08-25 NOTE — ED Provider Notes (Addendum)
[facility] EMERGENCY DEPARTMENT Provider Note   CSN: [id number] Arrival date & time: 08/25/18  [id number]     History   Chief Complaint Chief Complaint  Patient presents with  . Fall    HPI Kirsten Huang is a 47 y.o. female.  Patient fell 8 PM last night in a store.  She injured low back, right knee and fell onto outstretched hands.  She complains of burning at both palms.  Right knee pain and right-sided low back pain.  She reports she twisted her back when she fell.  Pain worse with weightbearing.  Not improved by anything.  She treated self with BC, without relief.  No other associated symptoms.  She denies hip pain.  HPI  Past Medical History:  Diagnosis Date  . Anxiety   . Asthma   . Chlamydia   . Depression   . Diabetes (HCC)   . Headache   . High cholesterol   . Hypertension   . Seizure (HCC)   . Stroke (cerebrum) [facility](HCC) 2014    Patient Active Problem List   Diagnosis Date Noted  . Generalized idiopathic epilepsy and epileptic syndromes, not intractable, without status epilepticus (HCC) 11/24/2017  . 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 Airway hyperreactivity 02/27/2016  . Back ache 02/27/2016  . Seizure (HCC) 02/22/2016  . Seizures (HCC) 02/20/2016  . Abnormal finding on thyroid function test 10/18/2015  . Borderline diabetes mellitus 07/14/2015  . Blush 07/14/2015  . Other specified symptoms and signs involving the digestive system and abdomen 07/14/2015  . Recurrent major depressive disorder, in partial remission (HCC) 06/16/2015  . Avitaminosis D 11/10/2014  . Temporary cerebral vascular dysfunction 06/01/2014  . Morbid (severe) obesity due to excess calories (HCC) 01/17/2014  . Benign essential HTN 01/17/2014  . HLD (hyperlipidemia) 05/28/2013    Past Surgical History:  Procedure Laterality Date  . TUBAL LIGATION  1994     OB History   No obstetric history on file.      Home Medications    Prior to Admission  medications   Medication Sig Start Date End Date Taking? Authorizing Provider  aspirin 325 MG EC tablet Take 325 mg by mouth daily.    [provider]  Aspirin-Salicylamide-Caffeine (BC HEADACHE PO) Take 1 Package by mouth daily as needed (pain).    [provider]  atorvastatin (LIPITOR) 10 MG tablet Take 10 mg by mouth daily. 10/23/17   [provider]  cetirizine (ZYRTEC) 10 MG tablet Take 10 mg by mouth daily as needed for allergies.    [provider]  hydrochlorothiazide (HYDRODIURIL) 25 MG tablet Take 25 mg by mouth daily.  12/01/15   [provider]  levETIRAcetam (KEPPRA XR) 500 MG 24 hr tablet Take 3 tablets (1,500 mg total) by mouth daily. 11/24/17   Anson FretAhern, Antonia B, MD  omeprazole (PRILOSEC) 20 MG capsule Take 20 mg daily by mouth.  12/20/16   [provider]  SAXENDA 18 MG/3ML SOPN Inject 0.3 mLs into the skin daily.  10/02/17   [provider]  sertraline (ZOLOFT) 100 MG tablet Take 100 mg by mouth daily.  01/30/16   [provider]    Family History Family History  Problem Relation Age of Onset  . Cancer Unknown   . Diabetes Unknown   . Seizures Neg Hx     Social History Social History   Tobacco Use  . Smoking status: Never Smoker  . Smokeless tobacco: Never Used  Substance Use  Topics  . Alcohol use: No  . Drug use: No     Allergies   Gadolinium derivatives   Review of Systems Review of Systems  Constitutional: Negative.   HENT: Negative.   Respiratory: Negative.   Cardiovascular: Negative.   Gastrointestinal: Negative.   Musculoskeletal: Positive for arthralgias and back pain.  Skin: Negative.   Neurological: Negative.   Psychiatric/Behavioral: Negative.   All other systems reviewed and are negative.    Physical Exam Updated Vital Signs BP (!) 126/58 (BP Location: Left Arm)   Pulse 67   Temp 98 F (36.7 C) (Oral)   Resp 18   Ht 5\' 5"  (1.651 m)   Wt 123.4 kg   LMP 08/11/2018    SpO2 98%   BMI 45.26 kg/m   Physical Exam Vitals signs and nursing note reviewed.  Constitutional:      Appearance: She is well-developed.  HENT:     Head: Normocephalic and atraumatic.  Eyes:     Conjunctiva/sclera: Conjunctivae normal.     Pupils: Pupils are equal, round, and reactive to light.  Neck:     Musculoskeletal: Neck supple.     Thyroid: No thyromegaly.     Trachea: No tracheal deviation.  Cardiovascular:     Rate and Rhythm: Normal rate and regular rhythm.     Heart sounds: No murmur.  Pulmonary:     Effort: Pulmonary effort is normal.     Breath sounds: Normal breath sounds.  Abdominal:     General: Bowel sounds are normal. There is no distension.     Palpations: Abdomen is soft.     Tenderness: There is no abdominal tenderness.  Musculoskeletal: Normal range of motion.        General: No tenderness.     Comments: Entire spine is nontender.  Right lower extremity skin intact.  Mildly tender over anterior knee.  No swelling or deformity.  Skin intact.  DP pulse 2+.  Right upper extremity skin intact.  Tender over palm.  No deformity or swelling.  DP pulse 2+.  Good capillary refill.  Left upper extremity skin intact.  Tender over palm.  No swelling or deformity.  Good capillary refill.  Radial pulse 2+.  Left lower extremity without swelling or deformity or tenderness neurovascular intact.  Entire spine is nontender.  Pelvis stable nontender.  She has no pain on internal or external rotation of either hip.  Skin:    General: Skin is warm and dry.     Findings: No rash.  Neurological:     General: No focal deficit present.     Mental Status: She is alert and oriented to person, place, and time.     Coordination: Coordination normal.     Comments: No nerves II through XII grossly intact.  Motor strength 5/5 overall.  Walks with slight limp favoring right leg      ED Treatments / Results  Labs (all labs ordered are listed, but only abnormal results are  displayed) Labs Reviewed - No data to display  EKG None  Radiology No results found.  Procedures Procedures (including critical care time)  Medications Ordered in ED Medications  acetaminophen (TYLENOL) tablet 1,000 mg (has no administration in time range)     Initial Impression / Assessment and Plan / ED Course  I have reviewed the triage vital signs and the nursing notes.  Pertinent labs & imaging results that were available during my care of the patient were reviewed by me and considered  in my medical decision making (see chart for details).     11:45 AM pain improved after treatment with Tylenol. X-rays reviewed by me Plan Tylenol.  Ice.  Follow-up with PMD if significant pain by next week Final Clinical Impressions(s) / ED Diagnoses  Diagnosis #1 fall #2 contusions multiple sites Final diagnoses:  None    ED Discharge Orders    None       Doug SouJacubowitz, Cecylia Brazill, MD 08/25/18 1154    Doug SouJacubowitz, Arlyss Weathersby, MD 08/25/18 1610

## 2018-11-26 ENCOUNTER — Telehealth: Payer: Self-pay

## 2018-11-26 NOTE — Telephone Encounter (Signed)
Unable to get in contact with the patient. I left a voicemail explaining that their appointment has been cancelled with Shawnie Dapper, NP on 11/30/2018. I offered a telephone visit but I must have verbal consent before I can schedule it. Office number provided.

## 2018-11-30 ENCOUNTER — Ambulatory Visit: Payer: BC Managed Care – PPO | Admitting: Family Medicine

## 2018-11-30 ENCOUNTER — Ambulatory Visit: Payer: BC Managed Care – PPO | Admitting: Neurology

## 2018-12-01 ENCOUNTER — Other Ambulatory Visit: Payer: Self-pay | Admitting: Neurology

## 2019-02-09 ENCOUNTER — Encounter (HOSPITAL_COMMUNITY): Payer: Self-pay | Admitting: *Deleted

## 2019-02-09 ENCOUNTER — Other Ambulatory Visit: Payer: Self-pay

## 2019-02-09 ENCOUNTER — Emergency Department (HOSPITAL_COMMUNITY): Payer: BC Managed Care – PPO

## 2019-02-09 ENCOUNTER — Inpatient Hospital Stay (HOSPITAL_COMMUNITY)
Admission: EM | Admit: 2019-02-09 | Discharge: 2019-02-11 | DRG: 100 | Disposition: A | Discharge status: Home / Self Care | Payer: BC Managed Care – PPO | Attending: Pulmonary Disease | Admitting: Pulmonary Disease

## 2019-02-09 DIAGNOSIS — E7800 Pure hypercholesterolemia, unspecified: Secondary | ICD-10-CM | POA: Diagnosis present

## 2019-02-09 DIAGNOSIS — E785 Hyperlipidemia, unspecified: Secondary | ICD-10-CM | POA: Diagnosis present

## 2019-02-09 DIAGNOSIS — I10 Essential (primary) hypertension: Secondary | ICD-10-CM | POA: Diagnosis present

## 2019-02-09 DIAGNOSIS — Z1159 Encounter for screening for other viral diseases: Secondary | ICD-10-CM

## 2019-02-09 DIAGNOSIS — Z978 Presence of other specified devices: Secondary | ICD-10-CM

## 2019-02-09 DIAGNOSIS — Z8673 Personal history of transient ischemic attack (TIA), and cerebral infarction without residual deficits: Secondary | ICD-10-CM

## 2019-02-09 DIAGNOSIS — Z9114 Patient's other noncompliance with medication regimen: Secondary | ICD-10-CM

## 2019-02-09 DIAGNOSIS — R402312 Coma scale, best motor response, none, at arrival to emergency department: Secondary | ICD-10-CM | POA: Diagnosis present

## 2019-02-09 DIAGNOSIS — R569 Unspecified convulsions: Secondary | ICD-10-CM

## 2019-02-09 DIAGNOSIS — G40901 Epilepsy, unspecified, not intractable, with status epilepticus: Secondary | ICD-10-CM | POA: Diagnosis not present

## 2019-02-09 DIAGNOSIS — R402112 Coma scale, eyes open, never, at arrival to emergency department: Secondary | ICD-10-CM | POA: Diagnosis present

## 2019-02-09 DIAGNOSIS — G40301 Generalized idiopathic epilepsy and epileptic syndromes, not intractable, with status epilepticus: Secondary | ICD-10-CM | POA: Diagnosis not present

## 2019-02-09 DIAGNOSIS — R402212 Coma scale, best verbal response, none, at arrival to emergency department: Secondary | ICD-10-CM | POA: Diagnosis present

## 2019-02-09 DIAGNOSIS — J9690 Respiratory failure, unspecified, unspecified whether with hypoxia or hypercapnia: Secondary | ICD-10-CM | POA: Diagnosis present

## 2019-02-09 DIAGNOSIS — F419 Anxiety disorder, unspecified: Secondary | ICD-10-CM | POA: Diagnosis present

## 2019-02-09 DIAGNOSIS — Z79899 Other long term (current) drug therapy: Secondary | ICD-10-CM

## 2019-02-09 DIAGNOSIS — J45909 Unspecified asthma, uncomplicated: Secondary | ICD-10-CM | POA: Diagnosis present

## 2019-02-09 DIAGNOSIS — E119 Type 2 diabetes mellitus without complications: Secondary | ICD-10-CM | POA: Diagnosis present

## 2019-02-09 DIAGNOSIS — Z6841 Body Mass Index (BMI) 40.0 and over, adult: Secondary | ICD-10-CM

## 2019-02-09 LAB — DIFFERENTIAL
Abs Immature Granulocytes: 0.17 10*3/uL — ABNORMAL HIGH (ref 0.00–0.07)
Basophils Absolute: 0.1 10*3/uL (ref 0.0–0.1)
Basophils Relative: 0 %
Eosinophils Absolute: 0.4 10*3/uL (ref 0.0–0.5)
Eosinophils Relative: 3 %
Immature Granulocytes: 1 %
Lymphocytes Relative: 44 %
Lymphs Abs: 6 10*3/uL — ABNORMAL HIGH (ref 0.7–4.0)
Monocytes Absolute: 0.6 10*3/uL (ref 0.1–1.0)
Monocytes Relative: 5 %
Neutro Abs: 6.4 10*3/uL (ref 1.7–7.7)
Neutrophils Relative %: 47 %

## 2019-02-09 LAB — CBC
HCT: 40.8 % (ref 36.0–46.0)
Hemoglobin: 12.1 g/dL (ref 12.0–15.0)
MCH: 25.4 pg — ABNORMAL LOW (ref 26.0–34.0)
MCHC: 29.7 g/dL — ABNORMAL LOW (ref 30.0–36.0)
MCV: 85.5 fL (ref 80.0–100.0)
Platelets: 286 10*3/uL (ref 150–400)
RBC: 4.77 MIL/uL (ref 3.87–5.11)
RDW: 14.6 % (ref 11.5–15.5)
WBC: 13.5 10*3/uL — ABNORMAL HIGH (ref 4.0–10.5)
nRBC: 0 % (ref 0.0–0.2)

## 2019-02-09 LAB — I-STAT CHEM 8, ED
BUN: 9 mg/dL (ref 6–20)
Calcium, Ion: 1.08 mmol/L — ABNORMAL LOW (ref 1.15–1.40)
Chloride: 103 mmol/L (ref 98–111)
Creatinine, Ser: 1.2 mg/dL — ABNORMAL HIGH (ref 0.44–1.00)
Glucose, Bld: 248 mg/dL — ABNORMAL HIGH (ref 70–99)
HCT: 41 % (ref 36.0–46.0)
Hemoglobin: 13.9 g/dL (ref 12.0–15.0)
Potassium: 3.9 mmol/L (ref 3.5–5.1)
Sodium: 136 mmol/L (ref 135–145)
TCO2: 15 mmol/L — ABNORMAL LOW (ref 22–32)

## 2019-02-09 LAB — POCT I-STAT 7, (LYTES, BLD GAS, ICA,H+H)
Acid-base deficit: 5 mmol/L — ABNORMAL HIGH (ref 0.0–2.0)
Bicarbonate: 21.5 mmol/L (ref 20.0–28.0)
Calcium, Ion: 1.24 mmol/L (ref 1.15–1.40)
HCT: 33 % — ABNORMAL LOW (ref 36.0–46.0)
Hemoglobin: 11.2 g/dL — ABNORMAL LOW (ref 12.0–15.0)
O2 Saturation: 98 %
Patient temperature: 98.7
Potassium: 4 mmol/L (ref 3.5–5.1)
Sodium: 139 mmol/L (ref 135–145)
TCO2: 23 mmol/L (ref 22–32)
pCO2 arterial: 45.8 mmHg (ref 32.0–48.0)
pH, Arterial: 7.279 — ABNORMAL LOW (ref 7.350–7.450)
pO2, Arterial: 115 mmHg — ABNORMAL HIGH (ref 83.0–108.0)

## 2019-02-09 LAB — I-STAT BETA HCG BLOOD, ED (MC, WL, AP ONLY): I-stat hCG, quantitative: <5 m[IU]/mL (ref <5)

## 2019-02-09 LAB — COMPREHENSIVE METABOLIC PANEL
ALT: 25 U/L (ref 0–44)
AST: 45 U/L — ABNORMAL HIGH (ref 15–41)
Albumin: 3.7 g/dL (ref 3.5–5.0)
Alkaline Phosphatase: 56 U/L (ref 38–126)
Anion gap: 24 — ABNORMAL HIGH (ref 5–15)
BUN: 7 mg/dL (ref 6–20)
CO2: 10 mmol/L — ABNORMAL LOW (ref 22–32)
Calcium: 9 mg/dL (ref 8.9–10.3)
Chloride: 101 mmol/L (ref 98–111)
Creatinine, Ser: 1.47 mg/dL — ABNORMAL HIGH (ref 0.44–1.00)
GFR calc Af Amer: 48 mL/min — ABNORMAL LOW (ref >60)
GFR calc non Af Amer: 42 mL/min — ABNORMAL LOW (ref >60)
Glucose, Bld: 258 mg/dL — ABNORMAL HIGH (ref 70–99)
Potassium: 4 mmol/L (ref 3.5–5.1)
Sodium: 135 mmol/L (ref 135–145)
Total Bilirubin: 0.4 mg/dL (ref 0.3–1.2)
Total Protein: 6.9 g/dL (ref 6.5–8.1)

## 2019-02-09 LAB — CBG MONITORING, ED: Glucose-Capillary: 239 mg/dL — ABNORMAL HIGH (ref 70–99)

## 2019-02-09 LAB — PROTIME-INR
INR: 1.2 (ref 0.8–1.2)
Prothrombin Time: 15.3 seconds — ABNORMAL HIGH (ref 11.4–15.2)

## 2019-02-09 LAB — APTT: aPTT: 23 seconds — ABNORMAL LOW (ref 24–36)

## 2019-02-09 MED ORDER — PROPOFOL 1000 MG/100ML IV EMUL
INTRAVENOUS | Status: AC
  Filled 2019-02-09: qty 100

## 2019-02-09 MED ORDER — LEVETIRACETAM IN NACL 1000 MG/100ML IV SOLN
1000.0000 mg | Freq: Once | INTRAVENOUS | Status: AC
  Administered 2019-02-09: 1000 mg via INTRAVENOUS

## 2019-02-09 MED ORDER — ETOMIDATE 2 MG/ML IV SOLN
INTRAVENOUS | Status: AC | PRN
  Administered 2019-02-09: 5 mg via INTRAVENOUS

## 2019-02-09 MED ORDER — SODIUM CHLORIDE 0.9% FLUSH
3.0000 mL | Freq: Once | INTRAVENOUS | Status: AC
  Administered 2019-02-09: 23:00:00 3 mL via INTRAVENOUS

## 2019-02-09 MED ORDER — SUCCINYLCHOLINE CHLORIDE 20 MG/ML IJ SOLN
INTRAMUSCULAR | Status: AC | PRN
  Administered 2019-02-09: 120 mg via INTRAVENOUS

## 2019-02-09 MED ORDER — PROPOFOL 1000 MG/100ML IV EMUL
INTRAVENOUS | Status: AC | PRN
  Administered 2019-02-09: 10 ug/kg/min via INTRAVENOUS

## 2019-02-09 NOTE — ED Triage Notes (Signed)
Pt had witnessed seizure with husband at home; hx of same, noncompliance with meds. First seizure with husband, pt was postical on ems arrival with deviation to left side, and nonreactive pupils. Pt's had total of 4 seizures (full body with oral trauma, no incontinence) with EMS. Given 5 mg IM enroute.

## 2019-02-09 NOTE — ED Provider Notes (Signed)
[facility] EMERGENCY DEPARTMENT Provider Note   CSN: [id number] Arrival date & time: 02/09/19  2141    History   Chief Complaint Chief Complaint  Patient presents with   Seizures    HPI Kirsten Huang is a 48 y.o. female with a past medical history of seizures, cerebellar stroke, diabetes, who presents today for evaluation of a seizure.  History obtained from EMS.  According to EMS patient had a witnessed seizure at home by her husband.  Unsure which medicine she is supposed to be on and she is reportedly noncompliant with her medications.  She had 1 generalized tonic-clonic seizure with her husband at home, when EMS arrived she was postictal.  She then had an additional 3 more seizures witnessed by EMS.  After her second seizure she was given 5 mg of versed IM.   She did not fall per husband.    She has a history of non compliance with medications.   Additional history obtained from family.  They report that she had a colonoscopy and endoscopy last week that went well.  They state that for the past 2 weeks she has been primarily drinking small cans of V8 with significantly decreased p.o. intake in an attempt to lose weight.  They state her last seizure was a few months ago.  She has not been complaining of pain, cough, or shortness of breath.  Family states that she initially was standing however they were able to get her into a chair and then onto the floor, no concern for trauma.      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   Airway hyperreactivity  02/27/2016   Back ache 02/27/2016   Seizure ([place]) 02/22/2016   Seizures ([place]) 02/20/2016   Abnormal finding on thyroid function test 10/18/2015   Borderline diabetes mellitus 07/14/2015   Blush 07/14/2015   Other specified symptoms and signs involving the digestive system and abdomen 07/14/2015   Recurrent major depressive disorder, in partial remission ([place]) 06/16/2015   Avitaminosis D 11/10/2014   Temporary cerebral vascular dysfunction 06/01/2014   Morbid (severe) obesity due to excess calories ([place]) 01/17/2014   Benign essential HTN 01/17/2014   HLD (hyperlipidemia) 05/28/2013    Past Surgical History:  Procedure Laterality Date   TUBAL LIGATION  1994     OB History   No obstetric history on file.      Home Medications    Prior to Admission medications   Medication Sig Start Date End Date Taking? Authorizing Provider  aspirin 325 MG EC tablet Take 325 mg by mouth daily.    [provider]  Aspirin-Salicylamide-Caffeine (BC HEADACHE PO) Take 1 Package by mouth daily as needed (pain).    [provider]  atorvastatin (LIPITOR) 10 MG tablet Take 10 mg by mouth daily. 10/23/17   [provider]  cetirizine (ZYRTEC) 10 MG tablet Take 10 mg by mouth daily as needed for allergies.    [provider]  hydrochlorothiazide (HYDRODIURIL) 25 MG tablet Take 25 mg  by mouth daily.  12/01/15   [provider]  levETIRAcetam (KEPPRA XR) 500 MG 24 hr tablet TAKE 3 TABLETS BY MOUTH DAILY 12/01/18   Anson FretAhern, Antonia B, MD  omeprazole (PRILOSEC) 20 MG capsule Take 20 mg daily by mouth.  12/20/16   [provider]  SAXENDA 18 MG/3ML SOPN Inject 0.3 mLs into the skin daily.  10/02/17   [provider]  sertraline (ZOLOFT) 100 MG tablet Take 100 mg by mouth daily.  01/30/16   [provider]    Family History Family History  Problem Relation Age of Onset   Cancer Other    Diabetes Other    Seizures Neg Hx      Social History Social History   Tobacco Use   Smoking status: Never Smoker   Smokeless tobacco: Never Used  Substance Use Topics   Alcohol use: No   Drug use: No     Allergies   Gadolinium derivatives   Review of Systems Review of Systems  Unable to perform ROS: Mental status change     Physical Exam Updated Vital Signs BP (!) 113/99    Pulse 93    Temp 98.7 F (37.1 C) (Rectal)    Resp 15    Ht 5\' 8"  (1.727 m)    SpO2 100%    BMI 41.36 kg/m   Physical Exam Vitals signs and nursing note reviewed.  Constitutional:      Appearance: She is well-developed. She is obese.  HENT:     Head: Normocephalic and atraumatic.     Mouth/Throat:     Mouth: Mucous membranes are moist.     Comments: There is pink secretions/drool.  No large tongue laceration.  Eyes:     General: No scleral icterus.       Right eye: No discharge.        Left eye: No discharge.     Conjunctiva/sclera: Conjunctivae normal.  Neck:     Musculoskeletal: Normal range of motion.  Cardiovascular:     Rate and Rhythm: Regular rhythm. Tachycardia present.  Pulmonary:     Effort: Pulmonary effort is normal. No respiratory distress.     Breath sounds: No stridor.  Abdominal:     General: There is no distension.     Tenderness: There is no abdominal tenderness.  Musculoskeletal:        General: No deformity.     Right lower leg: No edema.     Left lower leg: No edema.  Skin:    General: Skin is warm and dry.  Neurological:     Mental Status: She is unresponsive.     GCS: GCS eye subscore is 1. GCS verbal subscore is 1. GCS motor subscore is 1.     Motor: No abnormal muscle tone.     Comments: Moves right arm but does not appear purposeful.  Pupils are symmetric, gaze is wandering, not fixed.  No obvious seizure activity.  She is obtunded.  Psychiatric:     Comments: Unable to assess      ED Treatments / Results  Labs (all labs ordered are listed, but only abnormal results are  displayed) Labs Reviewed  PROTIME-INR - Abnormal; Notable for the following components:      Result Value   Prothrombin Time 15.3 (*)    All other components within normal limits  APTT - Abnormal; Notable for the following components:   aPTT 23 (*)    All other components within normal limits  CBC - Abnormal; Notable for the following components:   WBC 13.5 (*)    MCH 25.4 (*)    MCHC 29.7 (*)    All other components within normal limits  DIFFERENTIAL - Abnormal; Notable for the following components:   Lymphs Abs 6.0 (*)    Abs Immature Granulocytes 0.17 (*)    All other components within normal limits  COMPREHENSIVE METABOLIC PANEL - Abnormal; Notable for the following components:   CO2 10 (*)    Glucose, Bld 258 (*)    Creatinine, Ser 1.47 (*)    AST 45 (*)    GFR calc non Af Amer 42 (*)    GFR calc Af Amer 48 (*)    Anion gap 24 (*)    All other components within normal limits  CBG MONITORING, ED - Abnormal; Notable for the following components:   Glucose-Capillary 239 (*)    All other components within normal limits  I-STAT CHEM 8, ED - Abnormal; Notable for the following components:   Creatinine, Ser 1.20 (*)    Glucose, Bld 248 (*)    Calcium, Ion 1.08 (*)    TCO2 15 (*)    All other components within normal limits  POCT I-STAT 7, (LYTES, BLD GAS, ICA,H+H) - Abnormal; Notable for the following components:   pH, Arterial 7.279 (*)    pO2, Arterial 115.0 (*)    Acid-base deficit 5.0 (*)    HCT 33.0 (*)    Hemoglobin 11.2 (*)    All other components within normal limits  URINE CULTURE  SARS CORONAVIRUS 2  LEVETIRACETAM LEVEL  URINALYSIS, ROUTINE W REFLEX MICROSCOPIC  I-STAT BETA HCG BLOOD, ED (MC, WL, AP ONLY)  I-STAT ARTERIAL BLOOD GAS, ED    EKG None  Radiology Ct Head Wo Contrast  Result Date: 02/09/2019 CLINICAL DATA:  Altered LOC EXAM: CT HEAD WITHOUT CONTRAST TECHNIQUE: Contiguous axial images were obtained from the base of the skull through the vertex  without intravenous contrast. COMPARISON:  CT 02/14/2016, MRI 03/28/2013 FINDINGS: Brain: No acute territorial infarction, hemorrhage or intracranial mass. Ventricles are nonenlarged. Appearance of empty sella. Hypodense artifact left posterior fossa. Vascular: No hyperdense vessels.  No unexpected calcification Skull: Normal. Negative for fracture or focal lesion. Sinuses/Orbits: Moderate mucosal thickening in the ethmoid sinuses. Other: None IMPRESSION: Negative non contrasted CT appearance of the brain Electronically Signed   By: Jasmine PangKim  Fujinaga M.D.   On: 02/09/2019 23:15   Dg Chest Portable 1 View  Result Date: 02/09/2019 CLINICAL DATA:  Endotracheal tube placement EXAM: PORTABLE CHEST 1 VIEW COMPARISON:  07/17/2017. FINDINGS: The endotracheal tube terminates approximately 4.7 cm above the carina. The enteric tube extends below the left hemidiaphragm. There is no pneumothorax. No large pleural effusion. There is a mild hazy airspace opacity at the right lung base. There is no acute osseous abnormality. IMPRESSION: 1. Lines and tubes as above. 2. Hazy airspace opacity at the right lung base favored to represent atelectasis with aspiration not entirely excluded. Electronically Signed   By: Katherine Mantlehristopher  Green M.D.   On: 02/09/2019 22:49    Procedures .Critical Care Performed by: Cristina GongHammond, Annabeth Tortora W, PA-C Authorized by: Cristina GongHammond, Wayne Wicklund W, PA-C   Critical care provider statement:    Critical care time (minutes):  45   Critical care time was exclusive of:  Separately billable procedures and treating other patients and teaching time   Critical care was necessary to treat or prevent imminent or life-threatening deterioration of the following conditions:  CNS failure  or compromise   Critical care was time spent personally by me on the following activities:  Discussions with consultants, evaluation of patient's response to treatment, examination of patient, ordering and performing treatments and  interventions, ordering and review of laboratory studies, ordering and review of radiographic studies, pulse oximetry, re-evaluation of patient's condition, obtaining history from patient or surrogate and review of old charts Procedure Name: Intubation Date/Time: 02/09/2019 11:04 PM Performed by: Cristina Gong, PA-C Pre-anesthesia Checklist: Patient identified, Emergency Drugs available, Suction available, Patient being monitored and Timeout performed Oxygen Delivery Method: Non-rebreather mask Preoxygenation: Pre-oxygenation with 100% oxygen Induction Type: Rapid sequence Ventilation: Nasal airway inserted- appropriate to patient size Laryngoscope Size: Glidescope and 4 Grade View: Grade I Tube size: 7.5 mm Number of attempts: 1 Airway Equipment and Method: Video-laryngoscopy Placement Confirmation: ETT inserted through vocal cords under direct vision,  Positive ETCO2,  CO2 detector and Breath sounds checked- equal and bilateral Secured at: 23 cm Tube secured with: ETT holder Dental Injury: Teeth and Oropharynx as per pre-operative assessment  Comments: There was tongue trauma prior to intubation attempt.        (including critical care time)  Medications Ordered in ED Medications  sodium chloride flush (NS) 0.9 % injection 3 mL (3 mLs Intravenous Given 02/09/19 2254)  etomidate (AMIDATE) injection (5 mg Intravenous Given 02/09/19 2209)  succinylcholine (ANECTINE) injection (120 mg Intravenous Given 02/09/19 2209)  propofol (DIPRIVAN) 1000 MG/100ML infusion (40 mcg/kg/min  125 kg (Order-Specific) Intravenous Rate/Dose Change 02/09/19 2309)  levETIRAcetam (KEPPRA) IVPB 1000 mg/100 mL premix (0 mg Intravenous Stopped 02/09/19 2309)     Initial Impression / Assessment and Plan / ED Course  I have reviewed the triage vital signs and the nursing notes.  Pertinent labs & imaging results that were available during my care of the patient were reviewed by me and considered in my medical  decision making (see chart for details).  Clinical Course as of Feb 09 2348  Tue Feb 09, 2019  2259 Patient's family was updated.  They wish for patient's daughter Suzane Vanderweide to be the primary contact.  [phone]   [EH]  2321 Paged Elink   [EH]  2323 Spoke with Dr. Harvin Hazel    [EH]    Clinical Course User Index [EH] Cristina Gong, PA-C      Patient presents today for evaluation of for generalized tonic-clonic seizures without regaining consciousness in between.  On exam she was obtunded with a GCS of 3.  She was given 5 mg of versed IM after her second seizure.    No concern of trauma per EMS/family.  She is reportedly noncompliant with her antiepileptics.    With GCS of 3, after 4 generalized tonic-clonic seizures patient was not protecting her airway.  She was intubated without difficulty after being preoxygenated with nonrebreather and nasal cannula.    She is afebrile here.  Labs were obtained and reviewed, CBC shows a mild leukocytosis of 13.5 with elevated lymphocyte of 6.  Her CMP shows a glucose of 258, CO2 is 10 with a GFR of 48, anion gap of 24.  hCG is negative.  CT head was obtained without evidence of acute abnormalities.  I-STAT ABG was obtained after intubation showing a pH of 7.279.  Post intubation x-rays showed ET tube approximately 4.7 cm above the carina with a hazy opacity in the right lung base.  Coronavirus testing was ordered along with UA and urine culture.  PCCM was consulted for admission and management.  I spoke with Dr. Otelia LimesLindzen from neurology who will consult on patient.  I updated patient's family.  Patient will be admitted by PCCM.  This patient was seen as a shared visit with Dr. Rosalia Hammersay who was present for the intubation.    Final Clinical Impressions(s) / ED Diagnoses   Final diagnoses:  Status epilepticus [facility](HCC)    ED Discharge Orders    None       Norman ClayHammond, Jeniece Hannis W, PA-C 02/09/19 2352    Margarita Grizzleay, Danielle, MD 02/10/19  [phone]

## 2019-02-10 ENCOUNTER — Emergency Department (HOSPITAL_COMMUNITY): Payer: BC Managed Care – PPO

## 2019-02-10 DIAGNOSIS — Z9114 Patient's other noncompliance with medication regimen: Secondary | ICD-10-CM | POA: Diagnosis not present

## 2019-02-10 DIAGNOSIS — Z1159 Encounter for screening for other viral diseases: Secondary | ICD-10-CM | POA: Diagnosis not present

## 2019-02-10 DIAGNOSIS — E785 Hyperlipidemia, unspecified: Secondary | ICD-10-CM | POA: Diagnosis present

## 2019-02-10 DIAGNOSIS — Z79899 Other long term (current) drug therapy: Secondary | ICD-10-CM | POA: Diagnosis not present

## 2019-02-10 DIAGNOSIS — G40301 Generalized idiopathic epilepsy and epileptic syndromes, not intractable, with status epilepticus: Secondary | ICD-10-CM | POA: Diagnosis present

## 2019-02-10 DIAGNOSIS — R569 Unspecified convulsions: Secondary | ICD-10-CM | POA: Diagnosis not present

## 2019-02-10 DIAGNOSIS — E7800 Pure hypercholesterolemia, unspecified: Secondary | ICD-10-CM | POA: Diagnosis present

## 2019-02-10 DIAGNOSIS — I10 Essential (primary) hypertension: Secondary | ICD-10-CM | POA: Diagnosis present

## 2019-02-10 DIAGNOSIS — J9690 Respiratory failure, unspecified, unspecified whether with hypoxia or hypercapnia: Secondary | ICD-10-CM | POA: Diagnosis present

## 2019-02-10 DIAGNOSIS — G4089 Other seizures: Secondary | ICD-10-CM | POA: Diagnosis not present

## 2019-02-10 DIAGNOSIS — R402312 Coma scale, best motor response, none, at arrival to emergency department: Secondary | ICD-10-CM | POA: Diagnosis present

## 2019-02-10 DIAGNOSIS — E119 Type 2 diabetes mellitus without complications: Secondary | ICD-10-CM | POA: Diagnosis present

## 2019-02-10 DIAGNOSIS — F419 Anxiety disorder, unspecified: Secondary | ICD-10-CM | POA: Diagnosis present

## 2019-02-10 DIAGNOSIS — Z8673 Personal history of transient ischemic attack (TIA), and cerebral infarction without residual deficits: Secondary | ICD-10-CM | POA: Diagnosis not present

## 2019-02-10 DIAGNOSIS — J45909 Unspecified asthma, uncomplicated: Secondary | ICD-10-CM | POA: Diagnosis present

## 2019-02-10 DIAGNOSIS — R402212 Coma scale, best verbal response, none, at arrival to emergency department: Secondary | ICD-10-CM | POA: Diagnosis present

## 2019-02-10 DIAGNOSIS — G40901 Epilepsy, unspecified, not intractable, with status epilepticus: Secondary | ICD-10-CM

## 2019-02-10 DIAGNOSIS — Z6841 Body Mass Index (BMI) 40.0 and over, adult: Secondary | ICD-10-CM | POA: Diagnosis not present

## 2019-02-10 DIAGNOSIS — R402112 Coma scale, eyes open, never, at arrival to emergency department: Secondary | ICD-10-CM | POA: Diagnosis present

## 2019-02-10 LAB — GLUCOSE, CAPILLARY
Glucose-Capillary: 118 mg/dL — ABNORMAL HIGH (ref 70–99)
Glucose-Capillary: 117 mg/dL — ABNORMAL HIGH (ref 70–99)
Glucose-Capillary: 122 mg/dL — ABNORMAL HIGH (ref 70–99)
Glucose-Capillary: 96 mg/dL (ref 70–99)
Glucose-Capillary: 119 mg/dL — ABNORMAL HIGH (ref 70–99)
Glucose-Capillary: 107 mg/dL — ABNORMAL HIGH (ref 70–99)

## 2019-02-10 LAB — CBC
HCT: 34.7 % — ABNORMAL LOW (ref 36.0–46.0)
Hemoglobin: 11.2 g/dL — ABNORMAL LOW (ref 12.0–15.0)
MCH: 25.4 pg — ABNORMAL LOW (ref 26.0–34.0)
MCHC: 32.3 g/dL (ref 30.0–36.0)
MCV: 78.7 fL — ABNORMAL LOW (ref 80.0–100.0)
Platelets: 229 10*3/uL (ref 150–400)
RBC: 4.41 MIL/uL (ref 3.87–5.11)
RDW: 14.8 % (ref 11.5–15.5)
WBC: 10.9 10*3/uL — ABNORMAL HIGH (ref 4.0–10.5)
nRBC: 0 % (ref 0.0–0.2)

## 2019-02-10 LAB — RENAL FUNCTION PANEL
Albumin: 3.6 g/dL (ref 3.5–5.0)
Anion gap: 10 (ref 5–15)
BUN: 7 mg/dL (ref 6–20)
CO2: 24 mmol/L (ref 22–32)
Calcium: 8.7 mg/dL — ABNORMAL LOW (ref 8.9–10.3)
Chloride: 104 mmol/L (ref 98–111)
Creatinine, Ser: 0.98 mg/dL (ref 0.44–1.00)
GFR calc Af Amer: >60 mL/min (ref >60)
GFR calc non Af Amer: >60 mL/min (ref >60)
Glucose, Bld: 117 mg/dL — ABNORMAL HIGH (ref 70–99)
Phosphorus: 1.9 mg/dL — ABNORMAL LOW (ref 2.5–4.6)
Potassium: 4.3 mmol/L (ref 3.5–5.1)
Sodium: 138 mmol/L (ref 135–145)

## 2019-02-10 LAB — MAGNESIUM: Magnesium: 2.4 mg/dL (ref 1.7–2.4)

## 2019-02-10 LAB — SARS CORONAVIRUS 2: SARS Coronavirus 2: NOT DETECTED

## 2019-02-10 LAB — MRSA PCR SCREENING: MRSA by PCR: NEGATIVE

## 2019-02-10 LAB — TRIGLYCERIDES: Triglycerides: 250 mg/dL — ABNORMAL HIGH (ref <150)

## 2019-02-10 LAB — HIV ANTIBODY (ROUTINE TESTING W REFLEX): HIV Screen 4th Generation wRfx: NONREACTIVE

## 2019-02-10 MED ORDER — ORAL CARE MOUTH RINSE: 15.0000 mL | OROMUCOSAL | Status: DC

## 2019-02-10 MED ORDER — INSULIN ASPART 100 UNIT/ML ~~LOC~~ SOLN
0.0000 [IU] | SUBCUTANEOUS | Status: DC
  Administered 2019-02-10: 2 [IU] via SUBCUTANEOUS

## 2019-02-10 MED ORDER — PROPOFOL 1000 MG/100ML IV EMUL
INTRAVENOUS | Status: AC
  Administered 2019-02-10: 40 ug/kg/min
  Filled 2019-02-10: qty 100

## 2019-02-10 MED ORDER — ORAL CARE MOUTH RINSE
15.0000 mL | OROMUCOSAL | Status: DC
  Administered 2019-02-10 (×5): 15 mL via OROMUCOSAL

## 2019-02-10 MED ORDER — ACETAMINOPHEN 325 MG PO TABS
650.0000 mg | ORAL_TABLET | Freq: Four times a day (QID) | ORAL | Status: DC | PRN
  Administered 2019-02-10: 18:00:00 650 mg via ORAL
  Filled 2019-02-10: qty 2

## 2019-02-10 MED ORDER — CHLORHEXIDINE GLUCONATE CLOTH 2 % EX PADS
6.0000 | MEDICATED_PAD | Freq: Every day | CUTANEOUS | Status: DC
  Administered 2019-02-10: 6 via TOPICAL

## 2019-02-10 MED ORDER — PROPOFOL 1000 MG/100ML IV EMUL
5.0000 ug/kg/min | INTRAVENOUS | Status: DC
  Administered 2019-02-10 (×2): 70 ug/kg/min via INTRAVENOUS
  Administered 2019-02-10: 04:00:00 50 ug/kg/min via INTRAVENOUS
  Administered 2019-02-10: 70 ug/kg/min via INTRAVENOUS
  Administered 2019-02-10: 50 ug/kg/min via INTRAVENOUS
  Filled 2019-02-10 (×4): qty 100

## 2019-02-10 MED ORDER — FENTANYL CITRATE (PF) 100 MCG/2ML IJ SOLN: 50.0000 ug | INTRAMUSCULAR | Status: DC | PRN

## 2019-02-10 MED ORDER — ACETAMINOPHEN 160 MG/5ML PO SOLN
650.0000 mg | Freq: Four times a day (QID) | ORAL | Status: DC | PRN
  Administered 2019-02-10: 650 mg
  Filled 2019-02-10: qty 20.3

## 2019-02-10 MED ORDER — CHLORHEXIDINE GLUCONATE 0.12% ORAL RINSE (MEDLINE KIT)
15.0000 mL | Freq: Two times a day (BID) | OROMUCOSAL | Status: DC
  Administered 2019-02-10: 15 mL via OROMUCOSAL

## 2019-02-10 MED ORDER — IPRATROPIUM-ALBUTEROL 0.5-2.5 (3) MG/3ML IN SOLN: 3.0000 mL | RESPIRATORY_TRACT | Status: DC | PRN

## 2019-02-10 MED ORDER — CHLORHEXIDINE GLUCONATE 0.12% ORAL RINSE (MEDLINE KIT): 15.0000 mL | Freq: Two times a day (BID) | OROMUCOSAL | Status: DC

## 2019-02-10 MED ORDER — FAMOTIDINE IN NACL 20-0.9 MG/50ML-% IV SOLN
20.0000 mg | Freq: Two times a day (BID) | INTRAVENOUS | Status: DC
  Administered 2019-02-10 – 2019-02-11 (×3): 20 mg via INTRAVENOUS
  Filled 2019-02-10 (×3): qty 50

## 2019-02-10 MED ORDER — PHENOL 1.4 % MT LIQD: 1.0000 | OROMUCOSAL | Status: DC | PRN

## 2019-02-10 MED ORDER — INSULIN ASPART 100 UNIT/ML ~~LOC~~ SOLN: 0.0000 [IU] | SUBCUTANEOUS | Status: DC

## 2019-02-10 MED ORDER — FENTANYL CITRATE (PF) 100 MCG/2ML IJ SOLN
50.0000 ug | INTRAMUSCULAR | Status: DC | PRN
  Administered 2019-02-10: 100 ug via INTRAVENOUS
  Filled 2019-02-10: qty 2

## 2019-02-10 MED ORDER — BISACODYL 10 MG RE SUPP: 10.0000 mg | Freq: Every day | RECTAL | Status: DC | PRN

## 2019-02-10 MED ORDER — SODIUM CHLORIDE 0.9 % IV SOLN
750.0000 mg | Freq: Two times a day (BID) | INTRAVENOUS | Status: DC
  Administered 2019-02-10 – 2019-02-11 (×3): 750 mg via INTRAVENOUS
  Filled 2019-02-10 (×6): qty 7.5

## 2019-02-10 MED ORDER — SODIUM CHLORIDE 0.9 % IV SOLN
INTRAVENOUS | Status: DC | PRN
  Administered 2019-02-10: 23:00:00 250 mL via INTRAVENOUS

## 2019-02-10 MED ORDER — DOCUSATE SODIUM 50 MG/5ML PO LIQD: 100.0000 mg | Freq: Two times a day (BID) | ORAL | Status: DC | PRN

## 2019-02-10 MED ORDER — LEVETIRACETAM IN NACL 1500 MG/100ML IV SOLN: 1500.0000 mg | Freq: Two times a day (BID) | INTRAVENOUS | Status: DC

## 2019-02-10 MED ORDER — HEPARIN SODIUM (PORCINE) 5000 UNIT/ML IJ SOLN
5000.0000 [IU] | Freq: Three times a day (TID) | INTRAMUSCULAR | Status: DC
  Administered 2019-02-10 – 2019-02-11 (×4): 5000 [IU] via SUBCUTANEOUS
  Filled 2019-02-10 (×4): qty 1

## 2019-02-10 NOTE — Progress Notes (Signed)
Subjective: Patient has woken up, moving all extremities well.  Denies headache.  Exam: Vitals:   02/10/19 1000 02/10/19 1100  BP: 116/66 131/82  Pulse: 82 86  Resp: 20 (!) 22  Temp:    SpO2: 100% 100%   Gen: In bed, NAD Resp: non-labored breathing, no acute distress Abd: soft, nt  Neuro: MS: Awake, alert, follows commands and shakes head/nods appropriately CN: Endorses vision in both visual fields, pupils equal round and reactive, extraocular movements intact Motor: She moves all extremities with good strength Sensory: Endorses sensation bilaterally  Pertinent Labs: Creatinine 0.98  Impression: 48 year old female with breakthrough seizures.  Though she is febrile, no evidence of CNS infection and she denies headache.  With her improvement, I do not think we need any further evaluation for that.  I would continue her Keppra until I can ask about medication compliance.  Recommendations: 1) continue Keppra 750 twice daily 2) extubation, fever management per CCM  Roland Rack, MD [facility] [phone]  If 7pm- 7am, please page neurology on call as listed in [place].

## 2019-02-10 NOTE — Progress Notes (Signed)
RT transported patient from ed to 4n without any complications

## 2019-02-10 NOTE — Progress Notes (Signed)
NAME:  Kirsten Huang Laborde, MRN:  [id number], DOB:  26-Oct-1970, LOS: 0 ADMISSION DATE:  02/09/2019, CONSULTATION DATE: 02/10/2019 REFERRING MD: Lyndel SafeHammond, Elizabeth, CHIEF COMPLAINT: Status post seizure  Brief History   48 year old female prior history of seizure activity presents post status.  History of present illness   Patient is a 48 year old noncompliant female with a previous medical history consistent of seizures,cerebellar stroke, and asthma.  Patient had a witnessed seizure at home was generalized tonic-clonic requiring intubation.  Apparently she had a colonoscopy and endoscopy a week ago and has had minimal p.o. intake over that period of time.  According to those at the scene she did not have a fall. On my evaluation the patient is intubated and sedated.  She is ventilated.  Creatinine is 1.2.  CT scan of the brain was unremarkable.  There was a hypodense artifact noted left posterior fossa.  Hazy area is noted right lower lobe possible aspiration.  Past Medical History   . Anxiety   . Asthma   . Chlamydia   . Depression   . Diabetes (HCC)   . Headache   . High cholesterol   . Hypertension   . Seizure ([facility])   . Stroke (cerebrum) ([facility]) 2014   . Generalized idiopathic epilepsy and epileptic syndromes, not intractable, without status epilepticus ([facility]) 11/24/2017  . 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 Airway hyperreactivity 02/27/2016  . Back ache 02/27/2016  . Seizure (H[facility]) 02/22/2016  . Seizures (H[facility]) 02/20/2016  . Abnormal finding on thyroid function test 10/18/2015  . Borderline diabetes mellitus 07/14/2015  . Blush 07/14/2015  . Other specified symptoms and signs involving the digestive system and abdomen 07/14/2015  . Recurrent major depressive disorder, in partial remission (HCC) 06/16/2015  . Avitaminosis D 11/10/2014  . Temporary cerebral vascular dysfunction 06/01/2014  . Morbid (severe) obesity due to excess calories (H[facility]) 01/17/2014   . Benign essential HTN 01/17/2014  . HLD (hyperlipidemia) 05/28/2013      Significant Hospital Events   Admitted to the ICU 02/10/2019  Consults:  Neurology  Procedures:  Intubation  Significant Diagnostic Tests:  NA  Micro Data:  NA  Antimicrobials:  NA  Interim history/subjective:  No further seizures. Wakes up readily.  Objective   Blood pressure 103/63, pulse 76, temperature (!) 100.6 F (38.1 C), temperature source Axillary, resp. rate 20, height 5\' 8"  (1.727 m), weight 134.5 kg, SpO2 99 %.    Vent Mode: PSV;CPAP FiO2 (%):  [40 %-100 %] 40 % Set Rate:  [16 bmp-20 bmp] 20 bmp Vt Set:  [510 mL] 510 mL PEEP:  [5 cmH20-8 cmH20] 5 cmH20 Pressure Support:  [8 cmH20] 8 cmH20 Plateau Pressure:  [19 cmH20-21 cmH20] 20 cmH20   Intake/Output Summary (Last 24 hours) at 02/10/2019 1417 Last data filed at 02/10/2019 0900 Gross per 24 hour  Intake 1474.3 ml  Output 600 ml  Net 874.3 ml   Filed Weights   02/10/19 0208  Weight: 134.5 kg    Examination: General: Well-developed black female sedated and ventilated HENT: ETT, OGT in place, Lungs: clear chest Cardiovascular: Regular rate and rhythm no murmur gallop Abdomen: Benign bowel sounds positive Extremities: 1+ lower extremity edema Neuro: Sedated - wakes easily GU: Huang/A  Resolved Hospital Problem list   NA  Assessment & Plan:  1.  Status post tonic-clonic seizure: Keppra per neurology - stop sedation  2.  Respiratory failure requiring mechanical ventilation: extubate  3.  History of seizure  4.  History of cerebellar CVA  5.  History of asthma: Albuterol and Atrovent while on ventilator  Best practice:  Diet: Huang.p.o. Pain/Anxiety/Delirium protocol (if indicated): Propofol VAP protocol (if indicated): Yes DVT prophylaxis: SCD GI prophylaxis: Pepcid Glucose control: Monitor Mobility: Bedrest Code Status: Full Family Communication: None available Disposition: Admit to the ICU  Labs   CBC:  Recent Labs  Lab 02/09/19 2207 02/09/19 2218 02/09/19 2314 02/10/19 0222  WBC 13.5*  --   --  10.9*  NEUTROABS 6.4  --   --   --   HGB 12.1 13.9 11.2* 11.2*  HCT 40.8 41.0 33.0* 34.7*  MCV 85.5  --   --  78.7*  PLT 286  --   --  789    Basic Metabolic Panel: Recent Labs  Lab 02/09/19 2207 02/09/19 2218 02/09/19 2314 02/10/19 0017 02/10/19 0222  NA 135 136 139  --  138  K 4.0 3.9 4.0  --  4.3  CL 101 103  --   --  104  CO2 10*  --   --   --  24  GLUCOSE 258* 248*  --   --  117*  BUN 7 9  --   --  7  CREATININE 1.47* 1.20*  --   --  0.98  CALCIUM 9.0  --   --   --  8.7*  MG  --   --   --  2.4  --   PHOS  --   --   --   --  1.9*   GFR: Estimated Creatinine Clearance: 102.1 mL/min (by C-G formula based on SCr of 0.98 mg/dL). Recent Labs  Lab 02/09/19 2207 02/10/19 0222  WBC 13.5* 10.9*    Liver Function Tests: Recent Labs  Lab 02/09/19 2207 02/10/19 0222  AST 45*  --   ALT 25  --   ALKPHOS 56  --   BILITOT 0.4  --   PROT 6.9  --   ALBUMIN 3.7 3.6   No results for input(s): LIPASE, AMYLASE in the last 168 hours. No results for input(s): AMMONIA in the last 168 hours.  ABG    Component Value Date/Time   PHART 7.279 (L) 02/09/2019 2314   PCO2ART 45.8 02/09/2019 2314   PO2ART 115.0 (H) 02/09/2019 2314   HCO3 21.5 02/09/2019 2314   TCO2 23 02/09/2019 2314   ACIDBASEDEF 5.0 (H) 02/09/2019 2314   O2SAT 98.0 02/09/2019 2314     Coagulation Profile: Recent Labs  Lab 02/09/19 2207  INR 1.2    Cardiac Enzymes: No results for input(s): CKTOTAL, CKMB, CKMBINDEX, TROPONINI in the last 168 hours.  HbA1C: No results found for: HGBA1C  CBG: Recent Labs  Lab 02/09/19 2150 02/10/19 0409 02/10/19 0737 02/10/19 1112  GLUCAP 239* 118* 117* 122*   CRITICAL CARE Performed by: Kipp Brood   Total critical care time: 35 minutes  Critical care time was exclusive of separately billable procedures and treating other patients.  Critical care was  necessary to treat or prevent imminent or life-threatening deterioration.  Critical care was time spent personally by me on the following activities: development of treatment plan with patient and/or surrogate as well as nursing, discussions with consultants, evaluation of patient's response to treatment, examination of patient, obtaining history from patient or surrogate, ordering and performing treatments and interventions, ordering and review of laboratory studies, ordering and review of radiographic studies, pulse oximetry, re-evaluation of patient's condition and participation in multidisciplinary rounds.  Kipp Brood, MD [facility] ICU Physician [place]  Pager: [phone] Mobile: [phone] After hours: [phone].

## 2019-02-10 NOTE — Progress Notes (Signed)
Stat EEG completed, Dr. Lindzen aware. Results pending. 

## 2019-02-10 NOTE — ED Notes (Signed)
EEG at bedside.

## 2019-02-10 NOTE — Procedures (Signed)
History: 48 year old female with breakthrough seizures  Sedation: Propofol  Technique: This is a 21 channel routine scalp EEG performed at the bedside with bipolar and monopolar montages arranged in accordance to the international 10/20 system of electrode placement. One channel was dedicated to EKG recording.    Background: The background consists predominantly of low voltage diffuse beta range activity with some diffuse intermixed delta range activity.  There are occasional sleep structures seen.   Photic stimulation: Physiologic driving is not performed  EEG Abnormalities: Sedated EEG  Clinical Interpretation: This EEG is consistent with the patient's sedated state. There was no seizure or seizure predisposition recorded on this study. Please note that lack of epileptiform activity on EEG does not preclude the possibility of epilepsy.   Roland Rack, MD [facility] [phone]  If 7pm- 7am, please page neurology on call as listed in [place].

## 2019-02-10 NOTE — H&P (Signed)
NAME:  RAIVYN KABLER, MRN:  [id number], DOB:  02-Dec-1970, LOS: 0 ADMISSION DATE:  02/09/2019, CONSULTATION DATE: 02/10/2019 REFERRING MD: Wyn Quaker, CHIEF COMPLAINT: Status post seizure  Brief History   48 year old female prior history of seizure activity presents post status.  History of present illness   Patient is a 48 year old noncompliant female with a previous medical history consistent of seizures,cerebellar stroke, and asthma.  Patient had a witnessed seizure at home was generalized tonic-clonic requiring intubation.  Apparently she had a colonoscopy and endoscopy a week ago and has had minimal p.o. intake over that period of time.  According to those at the scene she did not have a fall. On my evaluation the patient is intubated and sedated.  She is ventilated.  Creatinine is 1.2.  CT scan of the brain was unremarkable.  There was a hypodense artifact noted left posterior fossa.  Hazy area is noted right lower lobe possible aspiration.  Past Medical History   . Anxiety   . Asthma   . Chlamydia   . Depression   . Diabetes ([place])   . Headache   . High cholesterol   . Hypertension   . Seizure ([place])   . 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 Generalized idiopathic epilepsy and epileptic syndromes, not intractable, without status epilepticus ([place]) 11/24/2017  . Epilepsy, generalized, convulsive ([place]) 03/28/2016  . Airway hyperreactivity 02/27/2016  . Back ache 02/27/2016  . Seizure ([place]) 02/22/2016  . Seizures ([place]) 02/20/2016  . Abnormal finding on thyroid function test 10/18/2015  . Borderline diabetes mellitus 07/14/2015  . Blush 07/14/2015  . Other specified symptoms and signs involving the digestive system and abdomen 07/14/2015  . Recurrent major depressive disorder, in partial remission ([place]) 06/16/2015  . Avitaminosis D 11/10/2014  . Temporary cerebral vascular dysfunction 06/01/2014  . Morbid (severe) obesity due to excess calories ([place]) 01/17/2014   . Benign essential HTN 01/17/2014  . HLD (hyperlipidemia) 05/28/2013      Significant Hospital Events   Admitted to the ICU 02/10/2019  Consults:  Neurology  Procedures:  Intubation  Significant Diagnostic Tests:  NA  Micro Data:  NA  Antimicrobials:  NA  Interim history/subjective:  NA  Objective   Blood pressure (!) 113/99, pulse 93, temperature 98.7 F (37.1 C), temperature source Rectal, resp. rate 15, height 5\' 8"  (1.727 m), SpO2 100 %.    Vent Mode: PRVC FiO2 (%):  [80 %-100 %] 80 % Set Rate:  [16 bmp-20 bmp] 20 bmp Vt Set:  [510 mL] 510 mL PEEP:  [8 cmH20] 8 cmH20 Plateau Pressure:  [20 cmH20] 20 cmH20  No intake or output data in the 24 hours ending 02/10/19 0004 There were no vitals filed for this visit.  Examination: General: Well-developed black female sedated and ventilated HENT: Within normal limits Lungs: Coarse bilateral wheezes Cardiovascular: Regular rate and rhythm no murmur gallop Abdomen: Benign bowel sounds positive Extremities: 1+ lower extremity edema Neuro: Sedated GU: N/A  Resolved Hospital Problem list   NA  Assessment & Plan:  1.  Status post tonic-clonic seizure: Keppra per neurology  2.  Respiratory failure requiring mechanical ventilation: Continue mechanical ventilation through the evening on propofol.  We will be able to wean to extubate tomorrow  3.  History of seizure  4.  History of cerebellar CVA  5.  History of asthma: Albuterol and Atrovent while on ventilator  Best practice:  Diet: N.p.o. Pain/Anxiety/Delirium protocol (if indicated): Propofol VAP protocol (if indicated): Yes DVT prophylaxis: SCD  GI prophylaxis: Pepcid Glucose control: Monitor Mobility: Bedrest Code Status: Full Family Communication: None available Disposition: Admit to the ICU  Labs   CBC: Recent Labs  Lab 02/09/19 2207 02/09/19 2218 02/09/19 2314  WBC 13.5*  --   --   NEUTROABS 6.4  --   --   HGB 12.1 13.9 11.2*  HCT 40.8  41.0 33.0*  MCV 85.5  --   --   PLT 286  --   --     Basic Metabolic Panel: Recent Labs  Lab 02/09/19 2207 02/09/19 2218 02/09/19 2314  NA 135 136 139  K 4.0 3.9 4.0  CL 101 103  --   CO2 10*  --   --   GLUCOSE 258* 248*  --   BUN 7 9  --   CREATININE 1.47* 1.20*  --   CALCIUM 9.0  --   --    GFR: CrCl cannot be calculated (Unknown ideal weight.). Recent Labs  Lab 02/09/19 2207  WBC 13.5*    Liver Function Tests: Recent Labs  Lab 02/09/19 2207  AST 45*  ALT 25  ALKPHOS 56  BILITOT 0.4  PROT 6.9  ALBUMIN 3.7   No results for input(s): LIPASE, AMYLASE in the last 168 hours. No results for input(s): AMMONIA in the last 168 hours.  ABG    Component Value Date/Time   PHART 7.279 (L) 02/09/2019 2314   PCO2ART 45.8 02/09/2019 2314   PO2ART 115.0 (H) 02/09/2019 2314   HCO3 21.5 02/09/2019 2314   TCO2 23 02/09/2019 2314   ACIDBASEDEF 5.0 (H) 02/09/2019 2314   O2SAT 98.0 02/09/2019 2314     Coagulation Profile: Recent Labs  Lab 02/09/19 2207  INR 1.2    Cardiac Enzymes: No results for input(s): CKTOTAL, CKMB, CKMBINDEX, TROPONINI in the last 168 hours.  HbA1C: No results found for: HGBA1C  CBG: Recent Labs  Lab 02/09/19 2150  GLUCAP 239*    Review of Systems:   Patient sedated intubated unable to provide  Past Medical History  She,  has a past medical history of Anxiety, Asthma, Chlamydia, Depression, Diabetes (HCC), Headache, High cholesterol, Hypertension, Seizure (HCC), and Stroke (cerebrum) (HCC) (2014).   Surgical History    Past Surgical History:  Procedure Laterality Date  . TUBAL LIGATION  1994     Social History   reports that she has never smoked. She has never used smokeless tobacco. She reports that she does not drink alcohol or use drugs.   Family History   Her family history includes Cancer in an other family member; Diabetes in an other family member. There is no history of Seizures.   Allergies Allergies  Allergen  Reactions  . Gadolinium Derivatives Hives    PT HAD ONE HIVE ON RIGHT SIDE OF HEAD AT [place] AFTER CONTRAST INJECTION. FIRST TIME SHE HAS HAD MRI CONTRAST. GIVEN 50MG  BENADRYL AND CHECKED BY DR MAYNARD, RADIOLOGIST.     Home Medications  Prior to Admission medications   Medication Sig Start Date End Date Taking? Authorizing Provider  aspirin 325 MG EC tablet Take 325 mg by mouth daily.    [provider]  Aspirin-Salicylamide-Caffeine (BC HEADACHE PO) Take 1 Package by mouth daily as needed (pain).    [provider]  atorvastatin (LIPITOR) 10 MG tablet Take 10 mg by mouth daily. 10/23/17   [provider]  cetirizine (ZYRTEC) 10 MG tablet Take 10 mg by mouth daily as needed for allergies.    [provider]  hydrochlorothiazide (HYDRODIURIL) 25 MG tablet Take 25 mg by mouth daily.  12/01/15   [provider]  levETIRAcetam (KEPPRA XR) 500 MG 24 hr tablet TAKE 3 TABLETS BY MOUTH DAILY 12/01/18   Anson FretAhern, Antonia B, MD  omeprazole (PRILOSEC) 20 MG capsule Take 20 mg daily by mouth.  12/20/16   [provider]  SAXENDA 18 MG/3ML SOPN Inject 0.3 mLs into the skin daily.  10/02/17   [provider]  sertraline (ZOLOFT) 100 MG tablet Take 100 mg by mouth daily.  01/30/16   [provider]     Critical care time: 35 minutes was spent in bedside evaluation, chart review and care planning.

## 2019-02-10 NOTE — Progress Notes (Signed)
Assisted tele visit to patient with daughter.  Corky Blumstein P, RN  

## 2019-02-10 NOTE — ED Notes (Signed)
Updated daughter on bed assignment, provided phone number to unit for updates

## 2019-02-10 NOTE — Progress Notes (Signed)
Pt temp 101.6 Blankets removed, room temp decreased, tylenol given. RN will continue to monitor.

## 2019-02-10 NOTE — Consult Note (Signed)
NEURO HOSPITALIST CONSULT NOTE   Requestig physician: Dr. Rosalia Hammersay  Reason for Consult: Breakthrough seizures x 4  History obtained from:  EDP and Chart   HPI:                                                                                                                                          Kirsten Huang is an 48 y.o. female with a history of seizures, prescribed Keppra 1500 mg QD, with known history of medication noncompliance, who presents with seizures x 5. First seizure was at home,witnessed by husband. She was postictal on EMS arrival with eye deviation to the left and unreactive pupils. She had 4 more seizures with EMs which were described as full-body. There was oral trauma but no incontinence. She was administered 5 mg IM Versed by EMS. 1000 mg IV Keppra was loaded in the ED. She was intubated for airway protection and is now on propofol gtt at a rate of 40. She sees Dr. Lucia GaskinsAhern as an outpatient.   Family stated to EMS that for the past 2 weeks she has been primarily drinking small cans of V8 with significantly decreased oral intake in an attempt to lose weight.  Her last seizure was a few months ago.   Her PMHx includes cerebellar stroke and DM.   Past Medical History:  Diagnosis Date  . Anxiety   . Asthma   . Chlamydia   . Depression   . Diabetes (H[facility])   . Headache   . High cholesterol   . Hypertension   . Seizure ([facility])   . Stroke (cerebrum) (H[facility]) 2014    Past Surgical History:  Procedure Laterality Date  . TUBAL LIGATION  1994    Family History  Problem Relation Age of Onset  . Cancer Other   . Diabetes Other   . Seizures Neg Hx               Social History:  reports that she has never smoked. She has never used smokeless tobacco. She reports that she does not drink alcohol or use drugs.  Allergies  Allergen Reactions  . Gadolinium Derivatives Hives    PT HAD ONE HIVE ON RIGHT SIDE OF HEAD AT [place] AFTER CONTRAST INJECTION. FIRST  TIME SHE HAS HAD MRI CONTRAST. GIVEN 50MG  BENADRYL AND CHECKED BY DR MAYNARD, RADIOLOGIST.    HOME MEDICATIONS:  ROS:                                                                                                                                       Unable to obtain due to AMS.    Blood pressure (!) 113/99, pulse 93, temperature 98.7 F (37.1 C), temperature source Rectal, resp. rate 15, height 5\' 8"  (1.727 m), SpO2 100 %.   General Examination:                                                                                                       Physical Exam  HEENT-  [place]/AT    Lungs- Intubated and sedated Extremities- Warm and well perfused   Neurological Examination Mental Status: Intubated and sedated on propofol. Eyes closed; will not open to verbal or noxious. Chews on ET tube intermittently. Will partially localize to sternal rub with LUE.  Cranial Nerves: II: PERRL. No blink to threat. III,IV, VI: Eyes exotropic. Minimal movement to oculocephalic maneuver. No nystagmus or forced gaze deviation.  V,VII: Face symmetric. Grimaces weakly and furrows brow weakly to noxious.  VIII: No response to voice IX,X: Intubated XI: Unable to assess XII: Intubated Motor/Sensory: Localizes semipurposefully with LUE to sternal rub.  Moves RUE minimally to noxious.  Withdraws BLE weakly to noxious, left more briskly than right No jerking, twitching, posturing or other adventitious movements noted.  Deep Tendon Reflexes: Hyperactive throughout, worse on the right.  Plantars: Upgoing on right, equivocal on left. Sustained clonus at right ankle with forced dorsiflexion.  Cerebellar/Gait: Unable to assess.    Lab Results: Basic Metabolic Panel: Recent Labs  Lab 02/09/19 2207 02/09/19 2218 02/09/19 2314  NA 135 136 139  K 4.0 3.9 4.0  CL 101 103  --   CO2 10*  --    --   GLUCOSE 258* 248*  --   BUN 7 9  --   CREATININE 1.47* 1.20*  --   CALCIUM 9.0  --   --     CBC: Recent Labs  Lab 02/09/19 2207 02/09/19 2218 02/09/19 2314  WBC 13.5*  --   --   NEUTROABS 6.4  --   --   HGB 12.1 13.9 11.2*  HCT 40.8 41.0 33.0*  MCV 85.5  --   --   PLT 286  --   --     Cardiac Enzymes: No results for input(s): CKTOTAL, CKMB, CKMBINDEX, TROPONINI in the last 168 hours.  Lipid Panel: No results for  input(s): CHOL, TRIG, HDL, CHOLHDL, VLDL, LDLCALC in the last 168 hours.  Imaging: Ct Head Wo Contrast  Result Date: 02/09/2019 CLINICAL DATA:  Altered LOC EXAM: CT HEAD WITHOUT CONTRAST TECHNIQUE: Contiguous axial images were obtained from the base of the skull through the vertex without intravenous contrast. COMPARISON:  CT 02/14/2016, MRI 03/28/2013 FINDINGS: Brain: No acute territorial infarction, hemorrhage or intracranial mass. Ventricles are nonenlarged. Appearance of empty sella. Hypodense artifact left posterior fossa. Vascular: No hyperdense vessels.  No unexpected calcification Skull: Normal. Negative for fracture or focal lesion. Sinuses/Orbits: Moderate mucosal thickening in the ethmoid sinuses. Other: None IMPRESSION: Negative non contrasted CT appearance of the brain Electronically Signed   By: Jasmine PangKim  Fujinaga M.D.   On: 02/09/2019 23:15   Dg Chest Portable 1 View  Result Date: 02/09/2019 CLINICAL DATA:  Endotracheal tube placement EXAM: PORTABLE CHEST 1 VIEW COMPARISON:  07/17/2017. FINDINGS: The endotracheal tube terminates approximately 4.7 cm above the carina. The enteric tube extends below the left hemidiaphragm. There is no pneumothorax. No large pleural effusion. There is a mild hazy airspace opacity at the right lung base. There is no acute osseous abnormality. IMPRESSION: 1. Lines and tubes as above. 2. Hazy airspace opacity at the right lung base favored to represent atelectasis with aspiration not entirely excluded. Electronically Signed   By:  Katherine Mantlehristopher  Green M.D.   On: 02/09/2019 22:49    Assessment: 48 year old female with breakthrough seizures x 4 in the setting of probable Keppra noncompliance 1. No clinical seizure activity on exam. Received 5 mg IM Versed by EMS, has been loaded with Keppra 1000 mg IV x 1, and is now on propofol gtt at a rate of 40.  2. CT head negative.  3. Na and Ca are normal.  4. Has been intubated for airway protection.  5. Exam under propofol sedation with residual Versed reveals decreased movement on the right relative to the left as well as hyperreflexia worse on the right than the left. These findings are most consistent with postictal state and suggest a left sided seizure-onset zone.   Recommendations: 1. STAT EEG (ordered) 2. Magnesium level (ordered) 3. Scheduled Keppra 750 mg IV BID 4. Frequent neuro checks and seizure precautions 5. Wean propofol as tolerated.   45 minutes spent in the emergent neurological evaluation and management of this critically ill patient.    Electronically signed: Dr. Caryl PinaEric Raelea Gosse 02/10/2019, 12:00 AM

## 2019-02-10 NOTE — Procedures (Signed)
Extubation Procedure Note  Patient Details:   Name: Kirsten Huang DOB: 27-Apr-1971 MRN: [id number]   Airway Documentation:    Vent end date: 02/10/19 Vent end time: 1454   Evaluation  O2 sats: stable throughout Complications: No apparent complications Patient did tolerate procedure well. Bilateral Breath Sounds: Rhonchi, Diminished    Pt extubated to 3LNC. No complications, no stridor noted. RN at bedside. Pt instructed on IS x5 highest goal reached 522mL.  Yes  Jorje Guild 02/10/2019, 1454

## 2019-02-11 ENCOUNTER — Inpatient Hospital Stay (HOSPITAL_COMMUNITY): Payer: BC Managed Care – PPO

## 2019-02-11 LAB — GLUCOSE, CAPILLARY
Glucose-Capillary: 100 mg/dL — ABNORMAL HIGH (ref 70–99)
Glucose-Capillary: 101 mg/dL — ABNORMAL HIGH (ref 70–99)

## 2019-02-11 MED ORDER — LEVETIRACETAM ER 500 MG PO TB24: 2000.0000 mg | ORAL_TABLET | Freq: Every day | ORAL | 0 refills | Status: DC

## 2019-02-11 NOTE — Discharge Summary (Signed)
Physician Discharge Summary   Patient ID: Kirsten Huang MRN: [id number] DOB/AGE: [birth year] 48 y.o.  Admit date: 02/09/2019 Discharge date: 02/11/2019                     Discharge Plan by Diagnosis   Breakthrough seizures - etiology likely multifactorial due to medication non-compliance and extra stress burden recently. - Continue preadmission keppra, dose increased from 1538m to 20019mdaily after discussion with neurology. - F/u with neurology as an outpatient as needed (Dr. AhJaynee Eagles [facility]).  Hx HTN, HLD. - Continue preadmission ASA, atorvastatin, HCTZ.  Hx DM. - Continue preadmission saxenda.  Hx anxiety, depression. - Continue preadmission sertraline.   Discharge Summary  Kirsten Huang a 48 y/o female with a PMH of seizures, CVA, HTN, HLD, DM, anxiety, depression.  [facility] after a witnessed generalized TC seizure at her home.  She required intubation in the ED for airway protection.  She apparently had a colonoscopy and endoscopy 1 week prior and ever since then, had decreased PO intake and had also missed a few doses of her medications including keppra.  In addition, pt endorses extra stress burden due to work demands with extra training's over the past few weeks.  CT of the brain was unremarkable.  She was liberated from the ventilator on 02/10/19.  On 6/11, she was deemed medically stable and was cleared for discharge home.  Her home dose of keppra has been increased from 150073maily to 2000m82mily after discussion with neurology.  Prior to discharge, she was educated on the importance of strict medication compliance.         Significant Hospital Events   6/9 > presented to ED, admitted early AM 6/10. 6/10 > extubated. 6/11 > discharge home.  Significant Diagnostic Studies  CT head 6/9 > negative.  Micro Data  SARS CoV2 6/11 > neg.  Antimicrobials  None.  Consults: date of consult/date signed off &  final recs:  Neurology.  Procedures (surgical and bedside):  ETT 6/9 > 6/10.  Objective:  Blood pressure 121/67, pulse 65, temperature 98.1 F (36.7 C), temperature source Oral, resp. rate (!) 21, height _0  (1.727 m), weight 131.7 kg, SpO2 97 %.    Vent Mode: PSV;CPAP FiO2 (%):  [40 %] 40 % Set Rate:  [20 bmp] 20 bmp Vt Set:  [510 mL] 510 mL PEEP:  [5 cmH20] 5 cmH20 Pressure Support:  [8 cmH20] 8 cmH20 Plateau Pressure:  [20 cmH20] 20 cmH20   Intake/Output Summary (Last 24 hours) at 02/11/2019 0835 Last data filed at 02/11/2019 0600 Gross per 24 hour  Intake 1184.17 ml  Output 2050 ml  Net -865.83 ml   Filed Weights   02/10/19 0208 02/11/19 0417  Weight: 134.5 kg 131.7 kg    Physical Examination: General: Adult female, resting in bed, in NAD. Neuro: A&O x 3, non-focal.  HEENT: [place]/AT. EOMI, sclerae anicteric. Cardiovascular: RRR, no M/R/G.  Lungs: Respirations even and unlabored.  CTA bilaterally, No W/R/R. Abdomen: BS x 4, soft, NT/ND.  Musculoskeletal: No gross deformities, no edema.  Skin: Intact, warm, no rashes.   Discharge Labs:  BMET Recent Labs  Lab 02/09/19 2207 02/09/19 2218 02/09/19 2314 02/10/19 0017 02/10/19 0222  NA 135 136 139  --  138  K 4.0 3.9 4.0  --  4.3  CL 101 103  --   --  104  CO2 10*  --   --   --  24  GLUCOSE 258* 248*  --   --  117*  BUN 7 9  --   --  7  CREATININE 1.47* 1.20*  --   --  0.98  CALCIUM 9.0  --   --   --  8.7*  MG  --   --   --  2.4  --   PHOS  --   --   --   --  1.9*    CBC Recent Labs  Lab 02/09/19 2207 02/09/19 2218 02/09/19 2314 02/10/19 0222  HGB 12.1 13.9 11.2* 11.2*  HCT 40.8 41.0 33.0* 34.7*  WBC 13.5*  --   --  10.9*  PLT 286  --   --  229    Anti-Coagulation Recent Labs  Lab 02/09/19 2207  INR 1.2    Discharge Instructions    Diet - low sodium heart healthy   Complete by: As directed    Increase activity slowly   Complete by: As directed           Allergies as of 02/11/2019       Reactions   Gadolinium Derivatives Hives   PT HAD ONE HIVE ON RIGHT SIDE OF HEAD AT [place] AFTER CONTRAST INJECTION. FIRST TIME SHE HAS HAD MRI CONTRAST. GIVEN 50MG BENADRYL AND CHECKED BY DR MAYNARD, RADIOLOGIST.      Medication List    STOP taking these medications   metroNIDAZOLE 500 MG tablet Commonly known as: FLAGYL   omeprazole 20 MG capsule Commonly known as: PRILOSEC     TAKE these medications   aspirin 325 MG EC tablet Take 325 mg by mouth daily.   atorvastatin 10 MG tablet Commonly known as: LIPITOR Take 10 mg by mouth daily.   BC HEADACHE PO Take 1 Package by mouth daily as needed (pain).   cetirizine 10 MG tablet Commonly known as: ZYRTEC Take 10 mg by mouth daily as needed for allergies.   FeroSul 325 (65 FE) MG tablet Generic drug: ferrous sulfate Take 325 mg by mouth 2 (two) times a day.   fluticasone 50 MCG/ACT nasal spray Commonly known as: FLONASE Place 2 sprays into both nostrils daily.   hydrochlorothiazide 12.5 MG capsule Commonly known as: MICROZIDE Take 12.5 mg by mouth daily.   levETIRAcetam 500 MG 24 hr tablet Commonly known as: KEPPRA XR Take 4 tablets (2,000 mg total) by mouth daily. What changed: how much to take   pantoprazole 40 MG tablet Commonly known as: PROTONIX Take 40 mg by mouth 2 (two) times a day.   Saxenda 18 MG/3ML Sopn Generic drug: Liraglutide -Weight Management Inject 0.3 mLs into the skin daily.   sertraline 100 MG tablet Commonly known as: ZOLOFT Take 200 mg by mouth daily.       Disposition: Home.   Discharge Condition:  Kirsten Huang has met maximum benefit of inpatient care and is medically stable and cleared for discharge.  Patient is pending follow up as above.    Time spent on discharge: Greater than 35 minutes.    Montey Hora, [employer] [facility] Pager: [phone].  If no answer, [phone] 02/11/2019, 8:35 AM

## 2019-02-11 NOTE — Progress Notes (Signed)
No further sz.   She is awake, alert, interactive and appropriate. At baseline.   I would favor increasing keppra XR to 2g qd and f/u with Dr. Jaynee Eagles.   Roland Rack, MD [facility] [phone]  If 7pm- 7am, please page neurology on call as listed in [place].

## 2019-02-11 NOTE — Evaluation (Signed)
Physical Therapy Evaluation Patient Details Name: Kirsten Huang MRN: [id number] DOB: 28-May-1971 Today's Date: 02/11/2019   History of Present Illness  Pt is a 48 y.o. F with significant PMH of seizures, CVA, HTN, DM, anxiety, dperession. Presents on 6/9 after a witness generalized tonic clonic seizure at home, requiring intubation for airway protection. Extubated 6/10.  Clinical Impression  Pt admitted with above diagnosis. Pt currently with functional limitations due to the deficits listed below (see PT Problem List). Pt reporting she is close to her functional baseline. Prior to admission, she is independent and works as a [employer] fast streamer. On PT evaluation, pt ambulating block around unit with no assistive device, without physical assistance. Displays mildly antalgic gait pattern, pt stating she had mild soreness along right lateral border of foot. Will follow acutely, no PT follow up recommended.      Follow Up Recommendations No PT follow up    Equipment Recommendations  None recommended by PT    Recommendations for Other Services       Precautions / Restrictions Precautions Precautions: Other (comment) Precaution Comments: seizure Restrictions Weight Bearing Restrictions: No      Mobility  Bed Mobility               General bed mobility comments: OOB in chair  Transfers Overall transfer level: Modified independent Equipment used: None                Ambulation/Gait Ambulation/Gait assistance: Modified independent (Device/Increase time) Gait Distance (Feet): 250 Feet Assistive device: None Gait Pattern/deviations: Step-through pattern;Antalgic;Decreased stride length Gait velocity: decreased   General Gait Details: Pt with mildly antalgic gait pattern, slower pace, occasionally reaching for railing in hallway. No overt LOB  Stairs            Wheelchair Mobility    Modified Rankin (Stroke Patients Only)       Balance Overall balance  assessment: Mild deficits observed, not formally tested                                           Pertinent Vitals/Pain Pain Assessment: Faces Faces Pain Scale: Hurts a little bit Pain Location: soreness along right lateral border of foot. not tender to palpation or aggravated by AROM in all planes  Pain Descriptors / Indicators: Sore Pain Intervention(s): Monitored during session    Home Living Family/patient expects to be discharged to:: Private residence Living Arrangements: Spouse/significant other Available Help at Discharge: Family(has a daughter from [facility]) Type of Home: House Home Access: Level entry     Home Layout: Two level        Prior Function Level of Independence: Independent         Comments: Works as a Advertising account executive: Right    Extremity/Trunk Assessment   Upper Extremity Assessment Upper Extremity Assessment: Defer to OT evaluation    Lower Extremity Assessment Lower Extremity Assessment: Overall WFL for tasks assessed       Communication   Communication: No difficulties  Cognition Arousal/Alertness: Awake/alert Behavior During Therapy: WFL for tasks assessed/performed Overall Cognitive Status: Within Functional Limits for tasks assessed                                        General  Comments  BP 125/67 (85) post mobility    Exercises     Assessment/Plan    PT Assessment Patient needs continued PT services  PT Problem List Decreased activity tolerance;Decreased balance;Decreased mobility       PT Treatment Interventions Gait training;Stair training;Functional mobility training;Therapeutic activities;Therapeutic exercise;Balance training;Patient/family education    PT Goals (Current goals can be found in the Care Plan section)  Acute Rehab PT Goals Patient Stated Goal: "Get back home." PT Goal Formulation: With patient Time For Goal Achievement: 02/25/19 Potential  to Achieve Goals: Good    Frequency Min 3X/week   Barriers to discharge        Co-evaluation               AM-PAC PT "6 Clicks" Mobility  Outcome Measure Help needed turning from your back to your side while in a flat bed without using bedrails?: None Help needed moving from lying on your back to sitting on the side of a flat bed without using bedrails?: None Help needed moving to and from a bed to a chair (including a wheelchair)?: None Help needed standing up from a chair using your arms (e.g., wheelchair or bedside chair)?: None Help needed to walk in hospital room?: None Help needed climbing 3-5 steps with a railing? : A Little 6 Click Score: 23    End of Session   Activity Tolerance: Patient tolerated treatment well Patient left: in chair;with call bell/phone within reach;with nursing/sitter in room Nurse Communication: Mobility status PT Visit Diagnosis: Unsteadiness on feet (R26.81);Other abnormalities of gait and mobility (R26.89)    Time: [id number] PT Time Calculation (min) (ACUTE ONLY): 16 min   Charges:   PT Evaluation $PT Eval Moderate Complexity: 1 Mod          Laurina Bustlearoline [name], [place]PT, DPT Acute Rehabilitation Services Pager [phone] Office [phone]   [name] 02/11/2019, 9:46 AM

## 2019-02-12 LAB — LEVETIRACETAM LEVEL: Levetiracetam Lvl: 16.4 ug/mL (ref 10.0–40.0)

## 2019-02-16 ENCOUNTER — Telehealth: Payer: Self-pay | Admitting: Neurology

## 2019-02-16 NOTE — Telephone Encounter (Signed)
Called patient and got her scheduled for an in office visit with Amy per Dr. Jaynee Eagles on 6/23.

## 2019-02-16 NOTE — Telephone Encounter (Signed)
-----   Message from Melvenia Beam, MD sent at 02/11/2019  3:43 PM EDT ----- Regarding: FW: Pt admitted Would you mind calling patient and scheduling a follow up with Amy please? She had breakthrough seizures, follow up sometime in the next 2-3 weeks if possible.  ----- Message ----- From: Greta Doom, MD Sent: 02/11/2019  12:46 PM EDT To: Melvenia Beam, MD Subject: Pt admitted                                    Admitted with breakthrough sz.

## 2019-02-23 NOTE — Telephone Encounter (Signed)
Katharine Rochefort called in and stated , she is coming into office tomorrow ,she was talking to someone while on mute and was saying how when she was discharged from [place] they called her and told her she had pneumonia and had to go to [facility] hospital within 24hours to be admitted and she has been in the hospital for a week and its been alot of things going on she needs to talk with the doctor about, there is no COVID alarm on her chart

## 2019-02-24 ENCOUNTER — Other Ambulatory Visit: Payer: Self-pay

## 2019-02-24 ENCOUNTER — Ambulatory Visit: Payer: BC Managed Care – PPO | Admitting: Family Medicine

## 2019-02-24 ENCOUNTER — Encounter: Payer: Self-pay | Admitting: Family Medicine

## 2019-02-24 VITALS — BP 134/78 | HR 56 | Temp 96.2°F | Ht 68.0 in | Wt 285.6 lb

## 2019-02-24 DIAGNOSIS — F411 Generalized anxiety disorder: Secondary | ICD-10-CM | POA: Diagnosis not present

## 2019-02-24 DIAGNOSIS — G40309 Generalized idiopathic epilepsy and epileptic syndromes, not intractable, without status epilepticus: Secondary | ICD-10-CM | POA: Diagnosis not present

## 2019-02-24 NOTE — Progress Notes (Signed)
PATIENT: Kirsten Huang DOB: 1970/09/13  REASON FOR VISIT: follow up HISTORY FROM: patient  Chief Complaint  Patient presents with  . Follow-up    Seizure follow up . Alone. Rm 8. Patient mentioned that she takes her medications with no problems. She stated that week of her seizure she had been dealing with the stress of work and family concerns, she also stated that she was eating very little.      HISTORY OF PRESENT ILLNESS: Today 02/24/19 Kirsten Huang is a 48 y.o. female here today for follow up of seizure. She was admitted to hospital on 6/9 for breakthrough seizure requiring intubation. She had received a phone call the morning of about her grandson that made her extremely angry. She doesn't remember much after that. She admits that she had also missed her dose of levetiracetam the day prior to seizure. She had an endoscopy and colonoscopy the week prior. She had limited PO intake for about a week. She was only drinking small cans of V8 in an attempt to lose weight. She also endorses significant stress recently. She works as a Therapist, occupational in [employer]. She reports being harassed at work due to racism. Over the past two years she has dealt with multiple altercations with her grandchild's mother. She reports being falsely charged with harrassment. She has been asked to consider disability from her supervisor. She feels that this was the perfect storm for seizure activity. She feels that stress levels are preventing her from thinking clearly and is uncertain if she can continue her job duties.  She is taking sertraline 200 mg daily and feels that this medication helps significantly.  She was discharged home with instructions to increase levetiracetam XR from mg daily to  daily. She is tolerating increased dose. No obvious adverse effects. She is not driving.   She is being evaluated for right foot pain and fibroids as well.    HISTORY: (copied from Dr Trevor Mace note on  11/24/2017)  Interval history 11/24/2017: Patient here for follow up of seizures. PMHx DM, HLD, HTN, epilepsy. We last saw her 4 months ago, we increased her medications to  daily on Keppra once daily as she was not compliant with twice a day. Lots of stress at work with her job but things are better now. She is compliant with her once daily dosing.She is now working in [employer] and loving her job. Sheis on 3rd shift 7-on and 7-off. She is doing well with medication, no side effects, no seizures. Last seizure 07/17/2017. So she can drive in May. She has broken up with her partner due to infidelity.   Interval history 08/06/2017: Breakthrough seizures in the setting of stress. She has problems at work.  She denies missing meds, new meds, illness or any other provoking factor.  Interval history 12/25/2016: Patient is here for follow up of GTCS with corresponding EEG findings.. Reviewed images with patient and MRI findings (agree with below). She is on Keppra  twice daily. The medication is fine, no side effects and no seizures. 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 Will switch to Keppra XR once daily.  IMPRESSION: This MRI of the brain with and without contrast with adequate attention to the temporal lobes shows the following: 1. Several punctate T2/FLAIR hyperintense foci in the deep white matter of both hemispheres consistent with chronic microvascular ischemic change. None of the foci appears to be acute. 2. Very mild mucoperiosteal thickening  in the right greater than left maxillary sinuses. 3. There is a "empty sella". This is a nonspecific finding and could be idiopathic. Elevated intracranial pressure can also cause this and further clinical correlation is suggested. 4. The structures of the mesial temporal lobes appear normal and symmetric.   Interval history 03/28/2016:Patient's here with husband. Patient had another episode of  convulsions and was seen at the emergency room and started on Keppra. Had a long discussion about her recent EEG that showed evidence of generalized discharges. Advised patient to stay on antiepileptic medications. Discussed seizures, she has her MRI scheduled soon. I recommend she stay on her antiepileptic medications. They've been causing her a lot of sedation and fatigue but she is feeling better. Discussed other possible medications we could use, she prefers to stay on Keppra over the next few weeks and see if side effects improve otherwise she will return so we can change her medications. Discussed seizures with her fiance as well. Discussed risks of seizures, advise compliance with medications as missing even one pill can cause of breakthrough seizure. She cannot drive or operate heavy machinery for 6 months. Discussed sudep.  CONCLUSION: This is an abnormal awake EEG. There is electrodiagnostic evidence of generalized epileptiform discharge, especially during hyperventilation and shortly following hyperventilation, consistent with generalized epilepsy disorder. She is having side effects to the keppra. She is very dizzy. She is getting used to the medication. She is compliant with it twice a day.   ZOX:[id number]:Kirsten N Fosteris a 48 y.o.femalehere as a referral from the emergency room for seizure. She has a past medical history of asthma, hypertension, diabetes, anxiety, depression, high cholesterol. She woke up on the 14th of this month and her fiance was very worried, in her face, he mouth hurt very bad, he said she was convulsing, she bit her tongue in 4 different spots. Her tongue was cut open. She was drooling. She would not wake up, she was very tired. She remembers the emergency room. She denies any other associated symptoms. No Hx of seizures or FHx of seizures. She was attacked by a swarm of bees a week previous otherwise no inciting events or previous illnesses or head trauma. .A few  months ago she woke up with a bitten tongue as well. Never had alteration of consciousness, or any other strange episodes in the past. Never urinated on herself. The episode lasted about 30 seconds. She denied any alcohol or drug use.denied any other focal neurologic deficits or symptoms.  Reviewed notes, labs and imaging from outside physicians, which showed:  CT of the head (personally reviewed and agree with the following): The ventricles are normal in size and configuration. There is invagination of CSF into the sella, consistent with a degree of empty sella. There is no intracranial mass, hemorrhage, extra-axial fluid collection, or midline shift. Gray-white compartments are normal. No acute infarct evident. The bony calvarium appears intact. The mastoid air cells are clear. Orbits appear symmetric and unremarkable bilaterally.  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 The ventricles are normal in size and configuration. There is no intracranial mass, hemorrhage, or focal gray -white compartment lesion.  BMP in the hospital was abnormal with potassium of 3,CBC was essentially normal   REVIEW OF SYSTEMS: Out of a complete 14 system review of symptoms, the patient complains only of the following symptoms, and all other reviewed systems are negative.  ALLERGIES: Allergies  Allergen Reactions  . Gadolinium Derivatives Hives    PT HAD ONE HIVE  ON RIGHT SIDE OF HEAD AT TEMPLE AFTER CONTRAST INJECTION. FIRST TIME SHE HAS HAD MRI CONTRAST. GIVEN 50MG  BENADRYL AND CHECKED BY DR MAYNARD, RADIOLOGIST.    HOME MEDICATIONS: Outpatient Medications Prior to Visit  Medication Sig Dispense Refill  . aspirin 325 MG EC tablet Take 325 mg by mouth daily.    . Aspirin-Salicylamide-Caffeine (BC HEADACHE PO) Take 1 Package by mouth daily as needed (pain).    Marland Kitchen. atorvastatin (LIPITOR) 10 MG tablet Take 10 mg by mouth daily.  3  . cetirizine (ZYRTEC) 10 MG tablet Take 10 mg by mouth daily as needed  for allergies.    . FEROSUL 325 (65 Fe) MG tablet Take 325 mg by mouth 2 (two) times a day.    . fluticasone (FLONASE) 50 MCG/ACT nasal spray Place 2 sprays into both nostrils daily.    . hydrochlorothiazide (MICROZIDE) 12.5 MG capsule Take 12.5 mg by mouth daily.     Marland Kitchen. levETIRAcetam (KEPPRA XR) 500 MG 24 hr tablet Take 4 tablets (2,000 mg total) by mouth daily. 270 tablet 0  . pantoprazole (PROTONIX) 40 MG tablet Take 40 mg by mouth 2 (two) times a day.    Marland Kitchen. SAXENDA 18 MG/3ML SOPN Inject 0.3 mLs into the skin daily.   1  . sertraline (ZOLOFT) 100 MG tablet Take 200 mg by mouth daily.      No facility-administered medications prior to visit.     PAST MEDICAL HISTORY: Past Medical History:  Diagnosis Date  . Anxiety   . Asthma   . Chlamydia   . Depression   . Diabetes (HCC)   . Headache   . High cholesterol   . Hypertension   . Seizure (HCC)   . Stroke (cerebrum) (HCC) 2014    PAST SURGICAL HISTORY: Past Surgical History:  Procedure Laterality Date  . TUBAL LIGATION  1994    FAMILY HISTORY: Family History  Problem Relation Age of Onset  . Cancer Other   . Diabetes Other   . Seizures Neg Hx     SOCIAL HISTORY: Social History   Socioeconomic History  . Marital status: Single    Spouse name: Not on file  . Number of children: 4  . Years of education: 2916  . Highest education level: Not on file  Occupational History  . Occupation: [employer]  Social Needs  . Financial resource strain: Not on file  . Food insecurity    Worry: Not on file    Inability: Not on file  . Transportation needs    Medical: Not on file    Non-medical: Not on file  Tobacco Use  . Smoking status: Never Smoker  . Smokeless tobacco: Never Used  Substance and Sexual Activity  . Alcohol use: No  . Drug use: No  . Sexual activity: Not on file  Lifestyle  . Physical activity    Days per week: Not on file    Minutes per session: Not on file  . Stress: Not on file  Relationships  .  Social Musicianconnections    Talks on phone: Not on file    Gets together: Not on file    Attends religious service: Not on file    Active member of club or organization: Not on file    Attends meetings of clubs or organizations: Not on file    Relationship status: Not on file  . Intimate partner violence    Fear of current or ex partner: Not on file    Emotionally  abused: Not on file    Physically abused: Not on file    Forced sexual activity: Not on file  Other Topics Concern  . Not on file  Social History Narrative   Lives with friend   Caffeine use: 1 cup of coffee every other day      PHYSICAL EXAM  Vitals:   02/24/19 0810  BP: 134/78  Pulse: (!) 56  Temp: (!) 96.2 F (35.7 C)  TempSrc: Oral  Weight: 285 lb 9.6 oz (129.5 kg)  Height: 5\' 8"  (1.727 m)   Body mass index is 43.43 kg/m.  Generalized: Well developed, in no acute distress  Cardiology: normal rate and rhythm, no murmur noted Neurological examination  Mentation: Alert oriented to time, place, history taking. Follows all commands speech and language fluent Cranial nerve II-XII: Pupils were equal round reactive to light. Extraocular movements were full, visual field were full on confrontational test. Facial sensation and strength were normal. Uvula tongue midline. Head turning and shoulder shrug  were normal and symmetric. Motor: The motor testing reveals 5 over 5 strength of all 4 extremities. Good symmetric motor tone is noted throughout.   Gait and station: Gait is normal. Tandem gait is normal. .   DIAGNOSTIC DATA (LABS, IMAGING, TESTING) - I reviewed patient records, labs, notes, testing and imaging myself where available.  No flowsheet data found.   Lab Results  Component Value Date   WBC 10.9 (H) 02/10/2019   HGB 11.2 (L) 02/10/2019   HCT 34.7 (L) 02/10/2019   MCV 78.7 (L) 02/10/2019   PLT 229 02/10/2019      Component Value Date/Time   NA 138 02/10/2019 0222   K 4.3 02/10/2019 0222   CL 104  02/10/2019 0222   CO2 24 02/10/2019 0222   GLUCOSE 117 (H) 02/10/2019 0222   BUN 7 02/10/2019 0222   CREATININE 0.98 02/10/2019 0222   CALCIUM 8.7 (L) 02/10/2019 0222   PROT 6.9 02/09/2019 2207   ALBUMIN 3.6 02/10/2019 0222   AST 45 (H) 02/09/2019 2207   ALT 25 02/09/2019 2207   ALKPHOS 56 02/09/2019 2207   BILITOT 0.4 02/09/2019 2207   GFRNONAA >60 02/10/2019 0222   GFRAA >60 02/10/2019 0222   Lab Results  Component Value Date   TRIG 250 (H) 02/10/2019   No results found for: HGBA1C No results found for: VITAMINB12 No results found for: TSH   ASSESSMENT AND PLAN 48 y.o. 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 here with     ICD-10-CM   1. 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 Generalized anxiety disorder  F41.1 Ambulatory referral to Psychiatry     Ms. Vitelli has tolerated increased dose of levetiracetam.  No seizure activity noted since hospitalization on 02/09/2019.  We will continue 2000 mg XR daily.  We have had a lengthy discussion regarding her concerns of anxiety and stress.  Although she feels in a much better place at this time, she continues to struggle with anxiety.  She will continue sertraline as prescribed for now.  I have also placed a referral to psychiatry for a formal evaluation.  I have also advised that she reach out to her primary care provider should she need more immediate assistance with stress and anxiety. She was provided education and resources for treatment of  anxiety. I have advised that she not drive until reevaluated in 6 months.  I have also asked her to reach out to her employer regarding her job duties.  From a seizure perspective, I feel that she is safe to return to work.  She verbalizes understanding and agreement with  this plan.   Orders Placed This Encounter  Procedures  . Levetiracetam level  . CMP  . CBC with Differential/Platelets  . Ambulatory referral to Psychiatry    Referral Priority:   Routine    Referral Type:   Psychiatric    Referral Reason:   Specialty Services Required    Requested Specialty:   Psychiatry    Number of Visits Requested:   1     No orders of the defined types were placed in this encounter.    I spent 15 minutes with the patient. 50% of this time was spent counseling and educating patient on plan of care and medications.     Shawnie Dappermy Cortne Amara, FNP-C 02/24/2019, 10:15 AM [facility] [address], [address] [place], [place] [address] [phone]

## 2019-02-24 NOTE — Progress Notes (Signed)
Personally  participated in, made any corrections needed, and agree with history, physical, neuro exam,assessment and plan as stated.     Antonia Ahern, MD [facility]     

## 2019-02-24 NOTE — Patient Instructions (Addendum)
Continue Keppra 2000mg  daily  Per [facility] requirement, please do not drive. We will reevaluate at follow up in 6 months.   Stay well hydrated  Eat healthy well balanced diet  Call PCP for discussion of anxiety and depression  Psychiatry referral for evaluation  Seizure, Adult When you have a seizure:  Parts of your body may move.  You may have a change in how aware or awake (conscious) you are.  You may shake (convulse). Seizures usually last from 30 seconds to 2 minutes. Usually, they are not harmful unless they last a long time. What are the signs or symptoms? Common symptoms of this condition include:  Shaking (convulsions).  Stiffness in the body.  Passing out (losing consciousness).  Uncontrolled movements in the: ? Arms or legs. ? Eyes. ? Head. ? Mouth. Some people have symptoms right before a seizure happens. These symptoms may include:  Fear.  Worry (anxiety).  Feeling like you are going to throw up (nausea).  Feeling like the room is spinning (vertigo).  Feeling like you saw or heard something before (dj vu).  Odd tastes or smells.  Changes in vision, such as seeing flashing lights or spots. Follow these instructions at home: Medicines   Take over-the-counter and prescription medicines only as told by your doctor.  Do not eat or drink anything that may keep your medicine from working, such as alcohol. Activity  Do not do any activities that would be dangerous if you had another seizure, like driving or swimming. Wait until your doctor says it is safe for you to do them.  If you live in the [place] ask your local DMV (department of motor vehicles) when you can drive.  Get plenty of rest. Teaching others   Teach friends and family what to do when you have a seizure. They should: ? Lay you on the ground. ? Protect your head and body. ? Loosen any tight clothing around your neck. ? Turn you on your side. ? Not hold you down. ? Not put  anything into your mouth. ? Know whether or not you need emergency care. ? Stay with you until you are better. General instructions  Contact your doctor each time you have a seizure.  Avoid anything that gives you seizures.  Keep a seizure diary. Write down: ? What you think caused each seizure. ? What you remember about each seizure.  Keep all follow-up visits as told by your doctor. This is important. Contact a doctor if:  You have another seizure.  You have seizures more often.  There is any change in what happens during your seizures.  You keep having seizures with treatment.  You have symptoms of being sick or having an infection. Get help right away if:  You have a seizure: ? That lasts longer than 5 minutes. ? That is different than seizures you had before. ? That makes it harder to breathe. ? After you hurt your head.  After a seizure, you cannot speak or use a part of your body.  After a seizure, you are confused or have a bad headache.  You have two or more seizures in a row.  You are having seizures more often.  You do not wake up right after a seizure.  You get hurt during a seizure. In an emergency:  These symptoms may be an emergency. Do not wait to see if the symptoms will go away. Get medical help right away. Call your local emergency services (911 in the  [place] Do not drive yourself to the hospital. Summary  Seizures usually last from 30 seconds to 2 minutes. Usually, they are not harmful unless they last a long time.  Do not eat or drink anything that may keep your medicine from working, such as alcohol.  Teach friends and family what to do when you have a seizure.  Contact your doctor each time you have a seizure. This information is not intended to replace advice given to you by your health care provider. Make sure you discuss any questions you have with your health care provider. Document Released: 02/05/2008 Document Revised: 05/13/2018  Document Reviewed: 09/25/2017 Elsevier Interactive Patient Education  2019 Elsevier Inc.   Generalized Anxiety Disorder, Adult Generalized anxiety disorder (GAD) is a mental health disorder. People with this condition constantly worry about everyday events. Unlike normal anxiety, worry related to GAD is not triggered by a specific event. These worries also do not fade or get better with time. GAD interferes with life functions, including relationships, work, and school. GAD can vary from mild to severe. People with severe GAD can have intense waves of anxiety with physical symptoms (panic attacks). What are the causes? The exact cause of GAD is not known. What increases the risk? This condition is more likely to develop in:  Women.  People who have a family history of anxiety disorders.  People who are very shy.  People who experience very stressful life events, such as the death of a loved one.  People who have a very stressful family environment. What are the signs or symptoms? People with GAD often worry excessively about many things in their lives, such as their health and family. They may also be overly concerned about:  Doing well at work.  Being on time.  Natural disasters.  Friendships. Physical symptoms of GAD include:  Fatigue.  Muscle tension or having muscle twitches.  Trembling or feeling shaky.  Being easily startled.  Feeling like your heart is pounding or racing.  Feeling out of breath or like you cannot take a deep breath.  Having trouble falling asleep or staying asleep.  Sweating.  Nausea, diarrhea, or irritable bowel syndrome (IBS).  Headaches.  Trouble concentrating or remembering facts.  Restlessness.  Irritability. How is this diagnosed? Your health care provider can diagnose GAD based on your symptoms and medical history. You will also have a physical exam. The health care provider will ask specific questions about your symptoms,  including how severe they are, when they started, and if they come and go. Your health care provider may ask you about your use of alcohol or drugs, including prescription medicines. Your health care provider may refer you to a mental health specialist for further evaluation. Your health care provider will do a thorough examination and may perform additional tests to rule out other possible causes of your symptoms. To be diagnosed with GAD, a person must have anxiety that:  Is out of his or her control.  Affects several different aspects of his or her life, such as work and relationships.  Causes distress that makes him or her unable to take part in normal activities.  Includes at least three physical symptoms of GAD, such as restlessness, fatigue, trouble concentrating, irritability, muscle tension, or sleep problems. Before your health care provider can confirm a diagnosis of GAD, these symptoms must be present more days than they are not, and they must last for six months or longer. How is this treated? The following therapies are  usually used to treat GAD:  Medicine. Antidepressant medicine is usually prescribed for long-term daily control. Antianxiety medicines may be added in severe cases, especially when panic attacks occur.  Talk therapy (psychotherapy). Certain types of talk therapy can be helpful in treating GAD by providing support, education, and guidance. Options include: ? Cognitive behavioral therapy (CBT). People learn coping skills and techniques to ease their anxiety. They learn to identify unrealistic or negative thoughts and behaviors and to replace them with positive ones. ? Acceptance and commitment therapy (ACT). This treatment teaches people how to be mindful as a way to cope with unwanted thoughts and feelings. ? Biofeedback. This process trains you to manage your body's response (physiological response) through breathing techniques and relaxation methods. You will work  with a therapist while machines are used to monitor your physical symptoms.  Stress management techniques. These include yoga, meditation, and exercise. A mental health specialist can help determine which treatment is best for you. Some people see improvement with one type of therapy. However, other people require a combination of therapies. Follow these instructions at home:  Take over-the-counter and prescription medicines only as told by your health care provider.  Try to maintain a normal routine.  Try to anticipate stressful situations and allow extra time to manage them.  Practice any stress management or self-calming techniques as taught by your health care provider.  Do not punish yourself for setbacks or for not making progress.  Try to recognize your accomplishments, even if they are small.  Keep all follow-up visits as told by your health care provider. This is important. Contact a health care provider if:  Your symptoms do not get better.  Your symptoms get worse.  You have signs of depression, such as: ? A persistently sad, cranky, or irritable mood. ? Loss of enjoyment in activities that used to bring you joy. ? Change in weight or eating. ? Changes in sleeping habits. ? Avoiding friends or family members. ? Loss of energy for normal tasks. ? Feelings of guilt or worthlessness. Get help right away if:  You have serious thoughts about hurting yourself or others. If you ever feel like you may hurt yourself or others, or have thoughts about taking your own life, get help right away. You can go to your nearest emergency department or call:  Your local emergency services (911 in the [place]  A suicide crisis helpline, such as the National Suicide Prevention Lifeline at 631-123-50181-(712)057-3898. This is open 24 hours a day. Summary  Generalized anxiety disorder (GAD) is a mental health disorder that involves worry that is not triggered by a specific event.  People with GAD  often worry excessively about many things in their lives, such as their health and family.  GAD may cause physical symptoms such as restlessness, trouble concentrating, sleep problems, frequent sweating, nausea, diarrhea, headaches, and trembling or muscle twitching.  A mental health specialist can help determine which treatment is best for you. Some people see improvement with one type of therapy. However, other people require a combination of therapies. This information is not intended to replace advice given to you by your health care provider. Make sure you discuss any questions you have with your health care provider. Document Released: 12/14/2012 Document Revised: 07/09/2016 Document Reviewed: 07/09/2016 Elsevier Interactive Patient Education  2019 [employer]

## 2019-02-26 LAB — COMPREHENSIVE METABOLIC PANEL
ALT: 14 IU/L (ref 0–32)
AST: 18 IU/L (ref 0–40)
Albumin/Globulin Ratio: 1.3 (ref 1.2–2.2)
Albumin: 4 g/dL (ref 3.8–4.8)
Alkaline Phosphatase: 52 IU/L (ref 39–117)
BUN/Creatinine Ratio: 13 (ref 9–23)
BUN: 12 mg/dL (ref 6–24)
Bilirubin Total: 0.2 mg/dL (ref 0.0–1.2)
CO2: 22 mmol/L (ref 20–29)
Calcium: 9.1 mg/dL (ref 8.7–10.2)
Chloride: 102 mmol/L (ref 96–106)
Creatinine, Ser: 0.9 mg/dL (ref 0.57–1.00)
GFR calc Af Amer: 87 mL/min/{1.73_m2} (ref >59)
GFR calc non Af Amer: 76 mL/min/{1.73_m2} (ref >59)
Globulin, Total: 3.1 g/dL (ref 1.5–4.5)
Glucose: 102 mg/dL — ABNORMAL HIGH (ref 65–99)
Potassium: 4.2 mmol/L (ref 3.5–5.2)
Sodium: 142 mmol/L (ref 134–144)
Total Protein: 7.1 g/dL (ref 6.0–8.5)

## 2019-02-26 LAB — CBC WITH DIFFERENTIAL/PLATELET
Basophils Absolute: 0 10*3/uL (ref 0.0–0.2)
Basos: 0 %
EOS (ABSOLUTE): 0.2 10*3/uL (ref 0.0–0.4)
Eos: 5 %
Hematocrit: 35 % (ref 34.0–46.6)
Hemoglobin: 11.1 g/dL (ref 11.1–15.9)
Immature Grans (Abs): 0 10*3/uL (ref 0.0–0.1)
Immature Granulocytes: 0 %
Lymphocytes Absolute: 1.7 10*3/uL (ref 0.7–3.1)
Lymphs: 36 %
MCH: 25.5 pg — ABNORMAL LOW (ref 26.6–33.0)
MCHC: 31.7 g/dL (ref 31.5–35.7)
MCV: 80 fL (ref 79–97)
Monocytes Absolute: 0.2 10*3/uL (ref 0.1–0.9)
Monocytes: 5 %
Neutrophils Absolute: 2.6 10*3/uL (ref 1.4–7.0)
Neutrophils: 54 %
Platelets: 225 10*3/uL (ref 150–450)
RBC: 4.36 x10E6/uL (ref 3.77–5.28)
RDW: 15.3 % (ref 11.7–15.4)
WBC: 4.8 10*3/uL (ref 3.4–10.8)

## 2019-02-26 LAB — LEVETIRACETAM LEVEL: Levetiracetam Lvl: 25.9 ug/mL (ref 10.0–40.0)

## 2019-02-27 ENCOUNTER — Other Ambulatory Visit: Payer: Self-pay | Admitting: Neurology

## 2019-03-01 ENCOUNTER — Encounter: Payer: Self-pay | Admitting: *Deleted

## 2019-03-01 ENCOUNTER — Telehealth: Payer: Self-pay

## 2019-03-01 ENCOUNTER — Telehealth: Payer: Self-pay | Admitting: Family Medicine

## 2019-03-01 NOTE — Telephone Encounter (Signed)
-----   Message from Debbora Presto, NP sent at 03/01/2019  7:46 AM EDT ----- Labs are unremarkable. Continue Keppra as prescribed.

## 2019-03-01 NOTE — Telephone Encounter (Addendum)
I spoke with Dr. Jaynee Eagles and a note was written containing the following:   From a seizure disorder perspective, Kirsten Huang may return to work with routine seizure precautions. Any other issues pertaining to her health and ability to work need to be addressed by primary care.   Letter printed and ready for Dr. Cathren Laine signature.

## 2019-03-01 NOTE — Telephone Encounter (Signed)
Amy, you saw patient last can you call her please and address this? I am seeing patients, for anything urgent she needs the ED thanks

## 2019-03-01 NOTE — Telephone Encounter (Signed)
Pt has called stating she has an emergency and needs to speak with Dr Jaynee Eagles.  Pt declined option of message being sent to RN.  Pt was given the option of sending Dr Jaynee Eagles a mychart message, pt was thankful for the option and said she would do that but also wanted a message put in system as well that she needs to speak about a matter that is urgent. Pt has asked that both her home and cell# be provided cell [phone] home [phone]

## 2019-03-01 NOTE — Telephone Encounter (Signed)
I spoke with the patient. She says her work is concerned about her returning to work and have approved her time off. The patient states she would return to work if she had a note saying she could. However she gets dizzy when she gets up, cannot walk much without feeling SOB. She says she works 3rd shift alone. Needs a note saying she either can work or cannot. She stated she feels like this is PCP responsibility because she has so many health issues going on however PCP declined a note. She also declined to do her 6 mo December f/u with Amy so I switched her to Dr. Jaynee Eagles same day @ 8:30.

## 2019-03-01 NOTE — Telephone Encounter (Signed)
I am happy to provide a note to say that from a seizure prospective, she is ok to return to work. My only restriction is that she can not drive for 6 months following last seizure. She needs to discuss any other concerns with PCP or appropriate specialists.

## 2019-03-01 NOTE — Telephone Encounter (Signed)
Spoke with the patient and she verbalized understanding her results. No questions or concerns at this time.   

## 2019-03-01 NOTE — Telephone Encounter (Signed)
I called the patient and reviewed the letter that Dr. Jaynee Eagles wrote. She verbalized appreciation. She requested the letter be emailed to her @ personal and work.   [email or web address]  Letter signed by Dr. Jaynee Eagles.

## 2019-03-02 NOTE — Telephone Encounter (Signed)
Done

## 2019-03-18 NOTE — Telephone Encounter (Signed)
Absolutely the energy drinks can affect her and decrease the seizure threshold especially in the setting of sleep deprivation. We are happy to write a letter to get her switched to 1st shift!

## 2019-03-18 NOTE — Telephone Encounter (Signed)
Called the patient and let her know that Amy would be back next week to discuss this. The pt stated she wanted Dr. Jaynee Eagles to call her back because she had a question if the energy drinks/caffeine she drinks at night could contribute. She stated her job has been very helpful and is trying to help her figure out why she is having seizures. She said they have offered to switch her from 3rd shift to 1st shift as pt works by herself at night, however they "can't just do it". They need more information about seizure precautions that were listed in the letter. Pt stated she is ready to get back to work and feels better.

## 2019-03-18 NOTE — Telephone Encounter (Signed)
Amy, would you call patient next week? Thanks  Lattie Haw, would u mind letting patient know that Amy will be back next week and will call her. She last saw Amy. There is no urgency she can wait until next weke. thanks

## 2019-03-18 NOTE — Telephone Encounter (Signed)
Pt has called re: the letter prepared for her to return to work.  Pt states her employer is willing to work with but the letter re: precautions that need to be taken did not go into enough details.  Pt states her job is willing to help such as change her work shift from 3rd to 1st shift.  Pt is asking for a call  To discuss

## 2019-03-30 ENCOUNTER — Encounter: Payer: Self-pay | Admitting: Neurology

## 2019-06-29 ENCOUNTER — Telehealth: Payer: Self-pay | Admitting: *Deleted

## 2019-06-29 NOTE — Telephone Encounter (Signed)
I called the pt to r/s her 08/18/2019 appt with Dr. Jaynee Eagles d/t provider being out of office. Pt informed me that she has a new PCP (Dr. Valora Piccolo on [facility]) and was referred to [facility] neurology dept where she is now getting her neurology care. 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 She thanked Dr. Jaynee Eagles and stated she did get her letter. Appt with Dr. Jaynee Eagles canceled. I told pt I would let Dr. Jaynee Eagles know. Pt verbalized appreciation.

## 2019-06-30 NOTE — Telephone Encounter (Signed)
Noted  

## 2019-08-18 ENCOUNTER — Ambulatory Visit: Payer: Self-pay | Admitting: Neurology

## 2019-08-18 ENCOUNTER — Ambulatory Visit: Payer: BC Managed Care – PPO | Admitting: Family Medicine

## 2020-07-08 IMAGING — CT CT HEAD WITHOUT CONTRAST
4 series · 17 of 47 positions shown, 19 images · non-contrast
Comparison: CT 02/14/2016, MRI 03/28/2013

CLINICAL DATA: Altered LOC

EXAM:
CT HEAD WITHOUT CONTRAST
TECHNIQUE: Contiguous axial images were obtained from the base of the skull
through the vertex without intravenous contrast.

[Series 3: head without · axial · non-contrast · 0.45mm/px · z∈[-145,-15]mm · 7 of 36 slices shown, 9 images]
[im 5/36  brain]
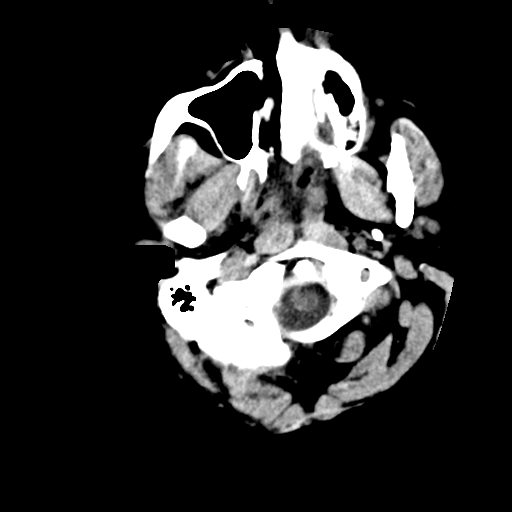
[im 5/36  bone]
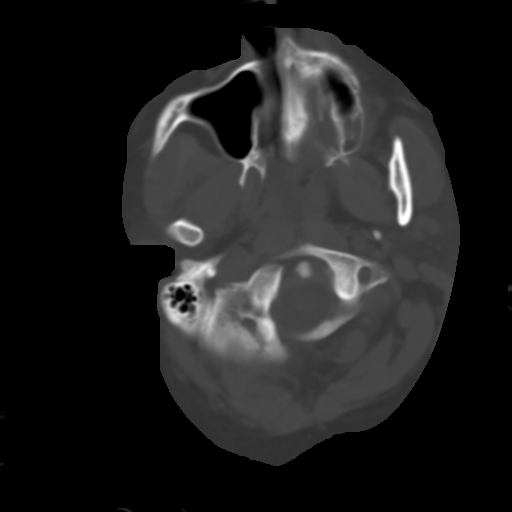
[im 9/36  brain]
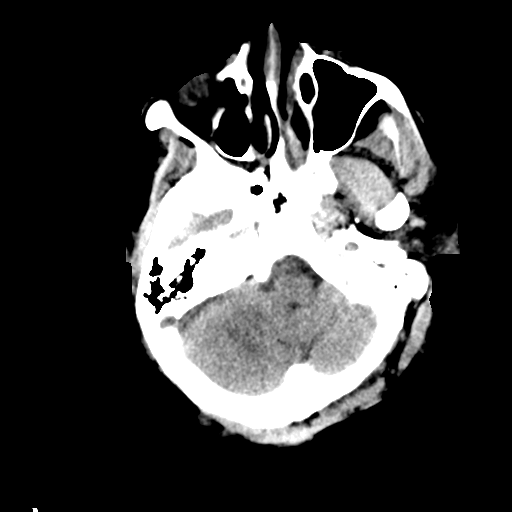
[im 14/36  brain]
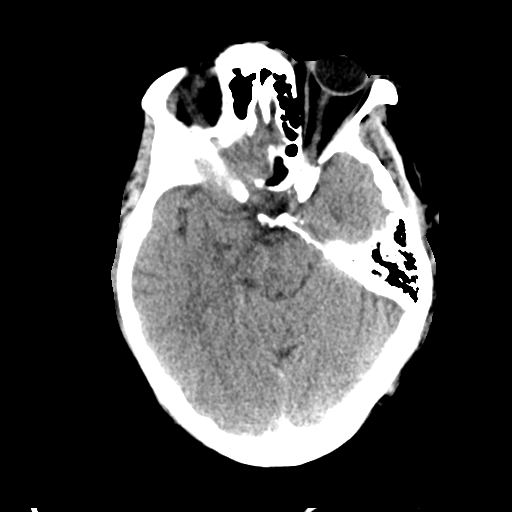
[im 18/36  brain]
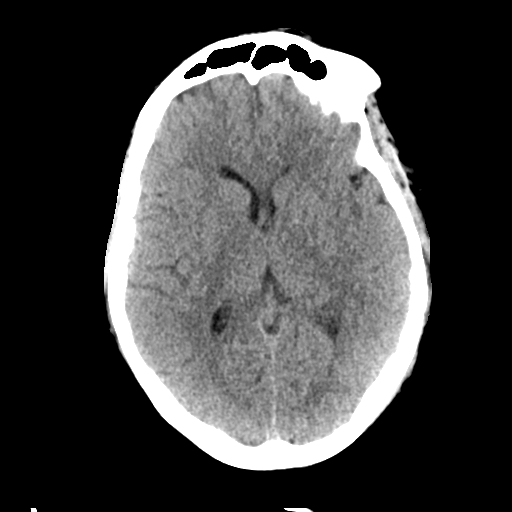
[im 22/36  brain]
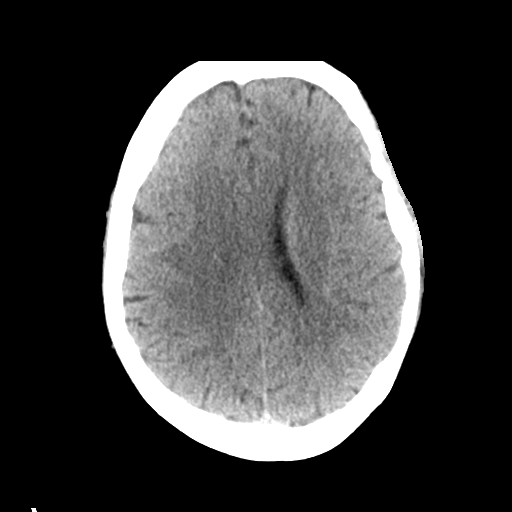
[im 22/36  bone]
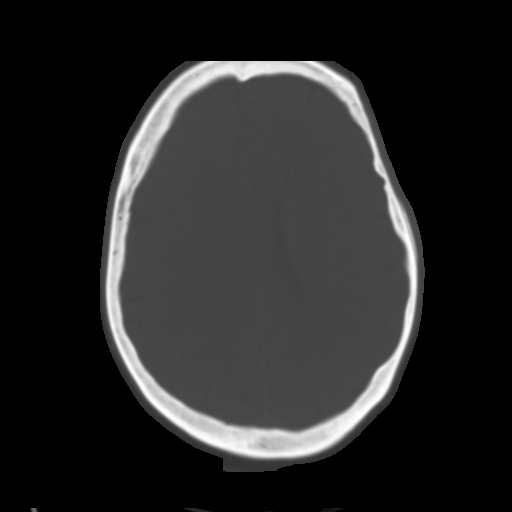
[im 27/36  brain]
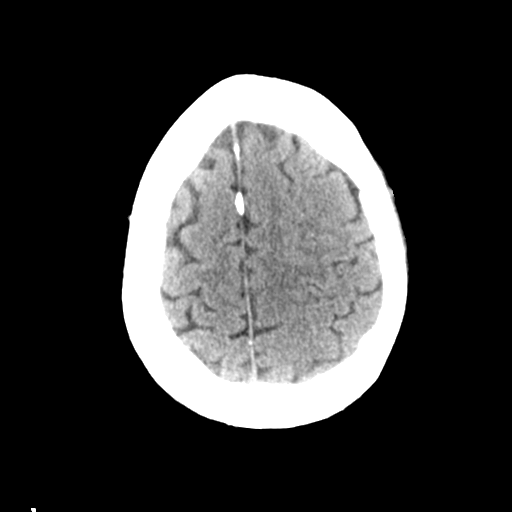
[im 31/36  brain]
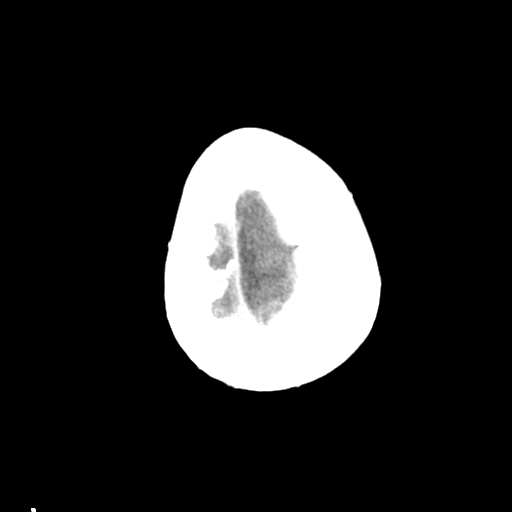

[Series 4: head bone · axial · 0.45mm/px · z∈[-147,-85]mm · 4 of 91 slices shown]
[im 10/91  bone]
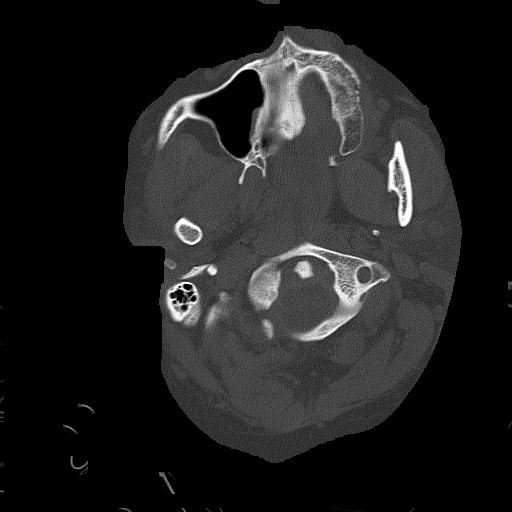
[im 19/91  bone]
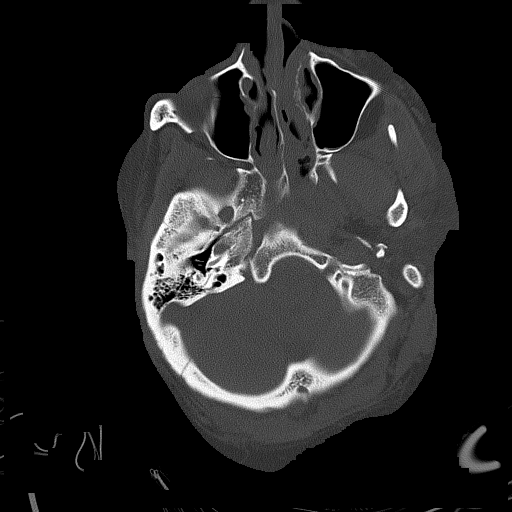
[im 28/91  bone]
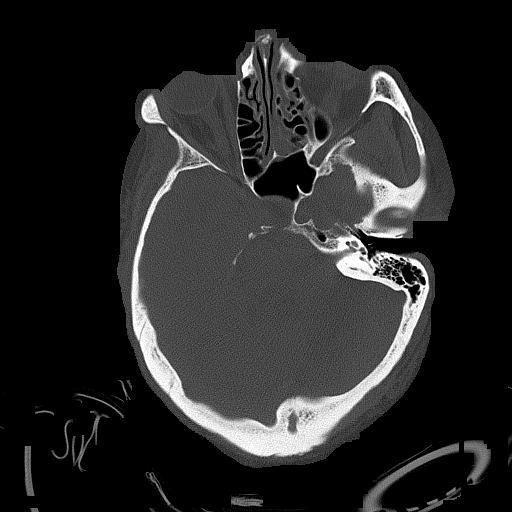
[im 41/91  bone]
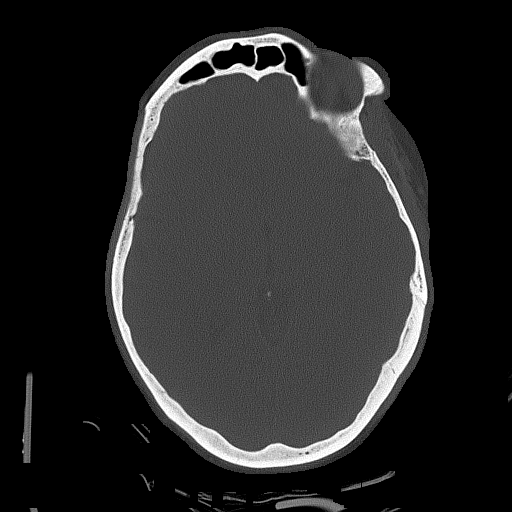

[Series 5: head without cor · coronal · non-contrast · 0.35mm/px · 3 of 73 slices shown]
[im 25/73  brain]
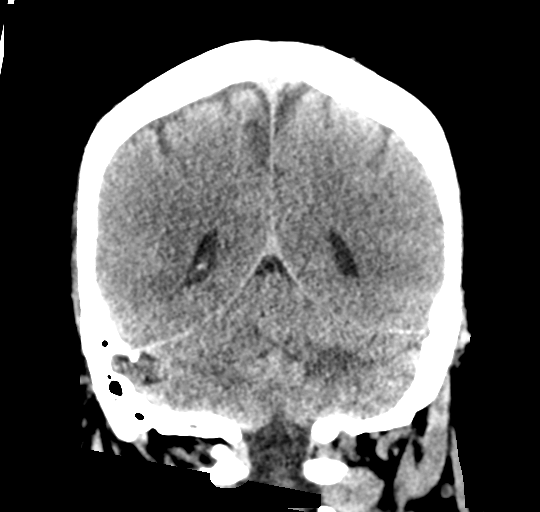
[im 33/73  brain]
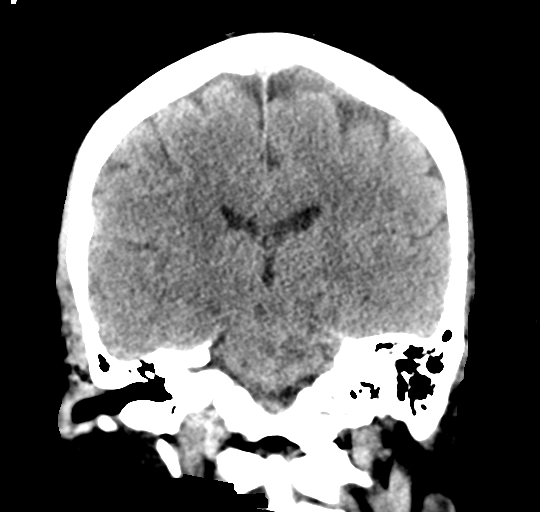
[im 41/73  brain]
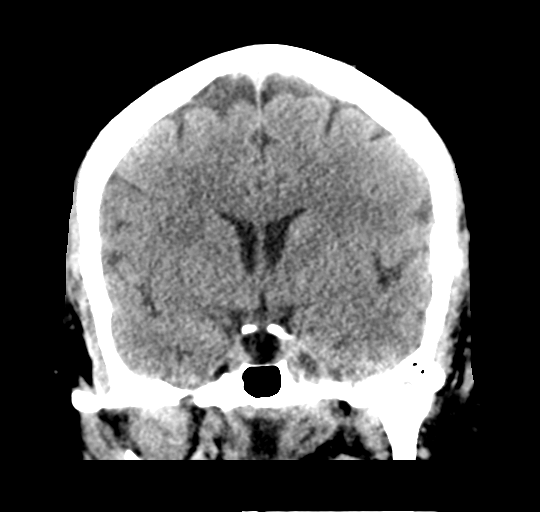

[Series 6: head without sag · sagittal · non-contrast · 0.35mm/px · 3 of 57 slices shown]
[im 23/57  brain]
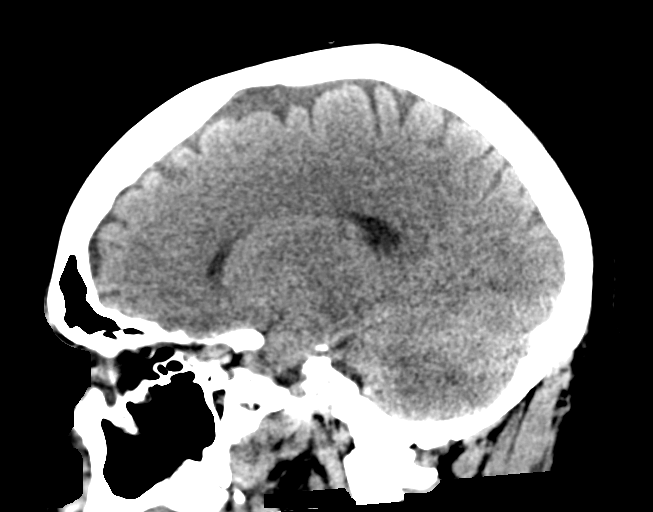
[im 29/57  brain]
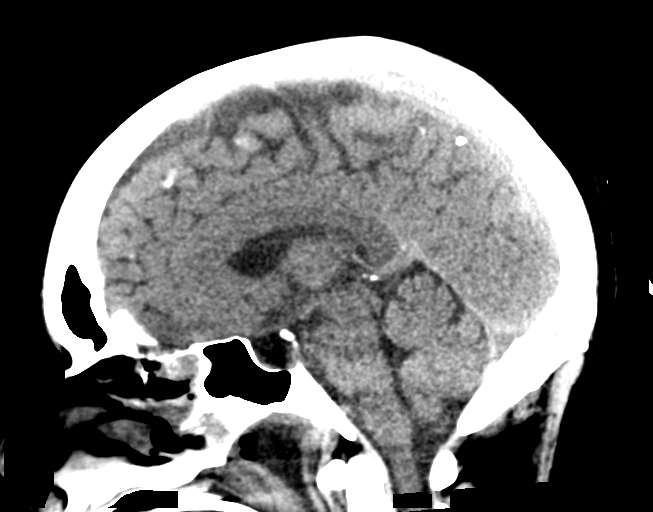
[im 34/57  brain]
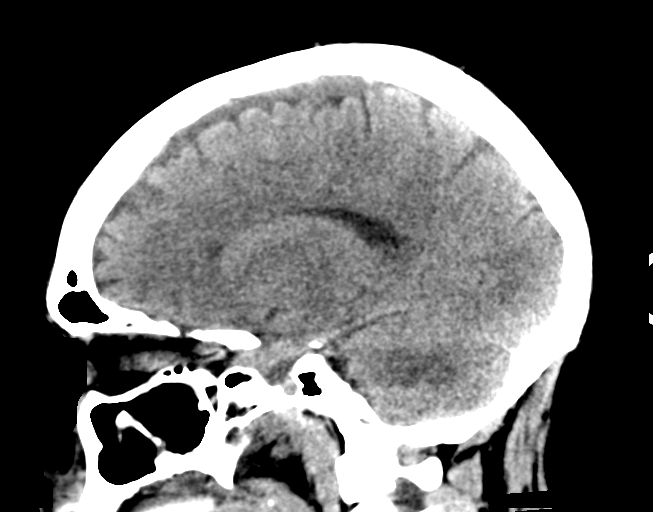

[17 of 47 positions shown; findings below may reference images not displayed]

FINDINGS: Brain: No acute territorial infarction, hemorrhage or intracranial
mass. Ventricles are nonenlarged. Appearance of empty sella.
Hypodense artifact left posterior fossa.

Vascular: No hyperdense vessels.  No unexpected calcification

Skull: Normal. Negative for fracture or focal lesion.

Sinuses/Orbits: Moderate mucosal thickening in the ethmoid sinuses.

Other: None
IMPRESSION: Negative non contrasted CT appearance of the brain

## 2023-06-03 ENCOUNTER — Encounter (HOSPITAL_COMMUNITY): Payer: Self-pay

## 2023-06-03 ENCOUNTER — Other Ambulatory Visit: Payer: Self-pay

## 2023-06-03 ENCOUNTER — Emergency Department (HOSPITAL_COMMUNITY): Payer: BC Managed Care – PPO

## 2023-06-03 ENCOUNTER — Emergency Department (HOSPITAL_COMMUNITY)
Admission: EM | Admit: 2023-06-03 | Discharge: 2023-06-04 | Disposition: A | Discharge status: Home / Self Care | Payer: BC Managed Care – PPO | Attending: Emergency Medicine | Admitting: Emergency Medicine

## 2023-06-03 DIAGNOSIS — R202 Paresthesia of skin: Secondary | ICD-10-CM | POA: Diagnosis not present

## 2023-06-03 DIAGNOSIS — Z7901 Long term (current) use of anticoagulants: Secondary | ICD-10-CM | POA: Diagnosis not present

## 2023-06-03 DIAGNOSIS — I10 Essential (primary) hypertension: Secondary | ICD-10-CM | POA: Diagnosis not present

## 2023-06-03 DIAGNOSIS — Z79899 Other long term (current) drug therapy: Secondary | ICD-10-CM | POA: Insufficient documentation

## 2023-06-03 DIAGNOSIS — G43109 Migraine with aura, not intractable, without status migrainosus: Secondary | ICD-10-CM

## 2023-06-03 DIAGNOSIS — R519 Headache, unspecified: Secondary | ICD-10-CM | POA: Insufficient documentation

## 2023-06-03 DIAGNOSIS — R531 Weakness: Secondary | ICD-10-CM | POA: Diagnosis not present

## 2023-06-03 DIAGNOSIS — Z7982 Long term (current) use of aspirin: Secondary | ICD-10-CM | POA: Insufficient documentation

## 2023-06-03 LAB — DIFFERENTIAL
Abs Immature Granulocytes: 0.01 10*3/uL (ref 0.00–0.07)
Basophils Absolute: 0 10*3/uL (ref 0.0–0.1)
Basophils Relative: 0 %
Eosinophils Absolute: 0.1 10*3/uL (ref 0.0–0.5)
Eosinophils Relative: 2 %
Immature Granulocytes: 0 %
Lymphocytes Relative: 41 %
Lymphs Abs: 2.2 10*3/uL (ref 0.7–4.0)
Monocytes Absolute: 0.3 10*3/uL (ref 0.1–1.0)
Monocytes Relative: 5 %
Neutro Abs: 2.8 10*3/uL (ref 1.7–7.7)
Neutrophils Relative %: 52 %

## 2023-06-03 LAB — CBG MONITORING, ED: Glucose-Capillary: 98 mg/dL (ref 70–99)

## 2023-06-03 LAB — COMPREHENSIVE METABOLIC PANEL
ALT: 26 U/L (ref 0–44)
AST: 28 U/L (ref 15–41)
Albumin: 4.2 g/dL (ref 3.5–5.0)
Alkaline Phosphatase: 62 U/L (ref 38–126)
Anion gap: 15 (ref 5–15)
BUN: 10 mg/dL (ref 6–20)
CO2: 21 mmol/L — ABNORMAL LOW (ref 22–32)
Calcium: 9.4 mg/dL (ref 8.9–10.3)
Chloride: 102 mmol/L (ref 98–111)
Creatinine, Ser: 0.95 mg/dL (ref 0.44–1.00)
GFR, Estimated: >60 mL/min (ref >60)
Glucose, Bld: 95 mg/dL (ref 70–99)
Potassium: 3.5 mmol/L (ref 3.5–5.1)
Sodium: 138 mmol/L (ref 135–145)
Total Bilirubin: 0.6 mg/dL (ref 0.3–1.2)
Total Protein: 8 g/dL (ref 6.5–8.1)

## 2023-06-03 LAB — POCT I-STAT, CHEM 8
BUN: 11 mg/dL (ref 6–20)
Calcium, Ion: 1.14 mmol/L — ABNORMAL LOW (ref 1.15–1.40)
Chloride: 104 mmol/L (ref 98–111)
Creatinine, Ser: 0.9 mg/dL (ref 0.44–1.00)
Glucose, Bld: 92 mg/dL (ref 70–99)
HCT: 45 % (ref 36.0–46.0)
Hemoglobin: 15.3 g/dL — ABNORMAL HIGH (ref 12.0–15.0)
Potassium: 3.5 mmol/L (ref 3.5–5.1)
Sodium: 140 mmol/L (ref 135–145)
TCO2: 21 mmol/L — ABNORMAL LOW (ref 22–32)

## 2023-06-03 LAB — CBC
HCT: 43.8 % (ref 36.0–46.0)
Hemoglobin: 14.7 g/dL (ref 12.0–15.0)
MCH: 27.8 pg (ref 26.0–34.0)
MCHC: 33.6 g/dL (ref 30.0–36.0)
MCV: 82.8 fL (ref 80.0–100.0)
Platelets: 230 10*3/uL (ref 150–400)
RBC: 5.29 MIL/uL — ABNORMAL HIGH (ref 3.87–5.11)
RDW: 13 % (ref 11.5–15.5)
WBC: 5.4 10*3/uL (ref 4.0–10.5)
nRBC: 0 % (ref 0.0–0.2)

## 2023-06-03 LAB — URINALYSIS, ROUTINE W REFLEX MICROSCOPIC
Bilirubin Urine: NEGATIVE
Glucose, UA: NEGATIVE mg/dL
Ketones, ur: NEGATIVE mg/dL
Leukocytes,Ua: NEGATIVE
Nitrite: NEGATIVE
Protein, ur: NEGATIVE mg/dL
Specific Gravity, Urine: 1.018 (ref 1.005–1.030)
pH: 6 (ref 5.0–8.0)

## 2023-06-03 LAB — RAPID URINE DRUG SCREEN, HOSP PERFORMED
Amphetamines: NOT DETECTED
Barbiturates: NOT DETECTED
Benzodiazepines: NOT DETECTED
Cocaine: NOT DETECTED
Opiates: NOT DETECTED
Tetrahydrocannabinol: NOT DETECTED

## 2023-06-03 LAB — APTT: aPTT: 28 s (ref 24–36)

## 2023-06-03 LAB — PROTIME-INR
INR: 1 (ref 0.8–1.2)
Prothrombin Time: 13.2 s (ref 11.4–15.2)

## 2023-06-03 LAB — ETHANOL: Alcohol, Ethyl (B): <10 mg/dL (ref <10)

## 2023-06-03 MED ORDER — SODIUM CHLORIDE 0.9 % IV SOLN: INTRAVENOUS | Status: DC

## 2023-06-03 MED ORDER — SODIUM CHLORIDE 0.9 % IV BOLUS
1000.0000 mL | Freq: Once | INTRAVENOUS | Status: AC
  Administered 2023-06-03: 1000 mL via INTRAVENOUS

## 2023-06-03 MED ORDER — KETOROLAC TROMETHAMINE 15 MG/ML IJ SOLN
15.0000 mg | Freq: Once | INTRAMUSCULAR | Status: AC
  Administered 2023-06-03: 15 mg via INTRAVENOUS
  Filled 2023-06-03: qty 1

## 2023-06-03 MED ORDER — IOHEXOL 350 MG/ML SOLN: 75.0000 mL | Freq: Once | INTRAVENOUS | Status: DC | PRN

## 2023-06-03 MED ORDER — DEXAMETHASONE SODIUM PHOSPHATE 10 MG/ML IJ SOLN
10.0000 mg | Freq: Once | INTRAMUSCULAR | Status: AC
  Administered 2023-06-03: 10 mg via INTRAVENOUS
  Filled 2023-06-03: qty 1

## 2023-06-03 MED ORDER — LORAZEPAM 2 MG/ML IJ SOLN
1.0000 mg | Freq: Once | INTRAMUSCULAR | Status: AC
  Administered 2023-06-03: 1 mg via INTRAVENOUS
  Filled 2023-06-03: qty 1

## 2023-06-03 MED ORDER — METOCLOPRAMIDE HCL 5 MG/ML IJ SOLN
10.0000 mg | Freq: Once | INTRAMUSCULAR | Status: AC
  Administered 2023-06-03: 10 mg via INTRAVENOUS
  Filled 2023-06-03: qty 2

## 2023-06-03 MED ORDER — IOHEXOL 350 MG/ML SOLN
100.0000 mL | Freq: Once | INTRAVENOUS | Status: AC | PRN
  Administered 2023-06-03: 100 mL via INTRAVENOUS

## 2023-06-03 NOTE — Consult Note (Signed)
Neurology Consultation  Reason for Consult: Code stroke-generalized weakness, confusion Referring Physician: Jeraldine Loots  CC: Code stroke-generalized weakness, confusion  History is obtained from: pt, chart  HPI: Kirsten Huang is a 52 y.o. female past medical history of anxiety, depression, diabetes, hypertension, prior stroke, seizures, migraines, on anticoagulation for possible DVT, last dose Xarelto this morning, brought into the emergency department via [facility] for complaints of altered mental status and speech difficulty. Last known well somewhere 2 days ago when she started having a headache but today was found by coworkers with some speech deficits-repetitive speech, reporting of a headache and reports numbness in both arms and legs.  On EMS assessment, had global weakness with no focal findings other than aphasia. Emergently assessed in the emergency department-see detailed exam below.  Essentially nonfocal exam. Multiple functional components on exam complicated the exam. Due to prior history of stroke, rushed to MRI after CT to make sure a stroke is not being missed. CT, CT angiogram head and neck, CT perfusion study and MRI were done because of concern for left MCA infarct and were all negative. Patient unable to provide reliable history at this  time.   LKW: Unclear IV thrombolysis given?: no, unclear last known well, also on Xarelto EVT: No-no ELVO Premorbid modified Rankin scale (mRS):0   ROS: The resting given her mentation  Past Medical History:  Diagnosis Date   Anxiety    Asthma    Chlamydia    Depression    Diabetes (HCC)    Headache    High cholesterol    Hypertension    Seizure (HCC)    Stroke (cerebrum) (HCC) 2014   Family History  Problem Relation Age of Onset   Cancer Other    Diabetes Other    Seizures Neg Hx    Social History:   reports that she has never smoked. She has never used smokeless tobacco. She reports that she does not drink  alcohol and does not use drugs.  Medications  Current Facility-Administered Medications:    iohexol (OMNIPAQUE) 350 MG/ML injection 75 mL, 75 mL, Intravenous, Once PRN, Milon Dikes, MD  Current Outpatient Medications:    aspirin 325 MG EC tablet, Take 325 mg by mouth daily., Disp: , Rfl:    Aspirin-Salicylamide-Caffeine (BC HEADACHE PO), Take 1 Package by mouth daily as needed (pain)., Disp: , Rfl:    atorvastatin (LIPITOR) 10 MG tablet, Take 10 mg by mouth daily., Disp: , Rfl: 3   cetirizine (ZYRTEC) 10 MG tablet, Take 10 mg by mouth daily as needed for allergies., Disp: , Rfl:    FEROSUL 325 (65 Fe) MG tablet, Take 325 mg by mouth 2 (two) times a day., Disp: , Rfl:    fluticasone (FLONASE) 50 MCG/ACT nasal spray, Place 2 sprays into both nostrils daily., Disp: , Rfl:    hydrochlorothiazide (MICROZIDE) 12.5 MG capsule, Take 12.5 mg by mouth daily. , Disp: , Rfl:    levETIRAcetam (KEPPRA XR) 500 MG 24 hr tablet, Take 4 tablets (2,000 mg total) by mouth daily., Disp: 360 tablet, Rfl: 3   pantoprazole (PROTONIX) 40 MG tablet, Take 40 mg by mouth 2 (two) times a day., Disp: , Rfl:    SAXENDA 18 MG/3ML SOPN, Inject 0.3 mLs into the skin daily. , Disp: , Rfl: 1   sertraline (ZOLOFT) 100 MG tablet, Take 200 mg by mouth daily. , Disp: , Rfl:   Exam: Current vital signs: BP (!) 177/84 (BP Location: Right Arm)   Pulse 64  Temp 98 F (36.7 C) (Oral)   Resp 13   Wt 133 kg   SpO2 99%   BMI 44.58 kg/m  Vital signs in last 24 hours: Temp:  [98 F (36.7 C)] 98 F (36.7 C) (10/01 2056) Pulse Rate:  [55-64] 64 (10/01 2056) Resp:  [13] 13 (10/01 2056) BP: (177)/(84) 177/84 (10/01 2056) SpO2:  [96 %-99 %] 99 % (10/01 2056) Weight:  [956 kg] 133 kg (10/01 2016) General: Awake alert in no distress HEENT: Normocephalic atraumatic Lungs: Clear Cardiovascular: Regular rhythm Abdomen nondistended nontender Neurological exam Patient is awake, alert, looking at people all around very  confused. Keeps stuttering and repeating her outpatient neurologist's name telling us Dr. Baldo Daub is her neurologist. To questions such as what her name was and how old she is, she gives her first name is Kirsten Huang and instead of telling her age, keeps repeating her date of birth. Appears very anxious. Cranial nerves: Pupils equal round react light, extract movements intact, visual fields full-blinks to threat from both sides, face appears symmetric, tongue and palate midline. On motor examination she has a drift in all 4 extremities-keeps arms up but then has giveaway weakness where arms fall down on the bed before 10 seconds.  Same thing with the lower extremities. Sensation intact to light touch Coordination difficult to assess given her mentation. NIHSS 1a Level of Conscious.: 0 1b LOC Questions: 2 1c LOC Commands: 0 2 Best Gaze: 0 3 Visual: 0 4 Facial Palsy: 0 5a Motor Arm - left: 2 5b Motor Arm - Right: 2 6a Motor Leg - Left: 3 6b Motor Leg - Right: 3 7 Limb Ataxia: 0 8 Sensory: 0 9 Best Language: 1 10 Dysarthria: 0 11 Extinct. and Inatten.: 0 TOTAL: 13   Labs I have reviewed labs in epic and the results pertinent to this consultation are:  CBC    Component Value Date/Time   WBC 5.4 06/03/2023 2009   RBC 5.29 (H) 06/03/2023 2009   HGB 15.3 (H) 06/03/2023 2016   HGB 11.1 02/24/2019 0913   HCT 45.0 06/03/2023 2016   HCT 35.0 02/24/2019 0913   PLT 230 06/03/2023 2009   PLT 225 02/24/2019 0913   MCV 82.8 06/03/2023 2009   MCV 80 02/24/2019 0913   MCH 27.8 06/03/2023 2009   MCHC 33.6 06/03/2023 2009   RDW 13.0 06/03/2023 2009   RDW 15.3 02/24/2019 0913   LYMPHSABS 2.2 06/03/2023 2009   LYMPHSABS 1.7 02/24/2019 0913   MONOABS 0.3 06/03/2023 2009   EOSABS 0.1 06/03/2023 2009   EOSABS 0.2 02/24/2019 0913   BASOSABS 0.0 06/03/2023 2009   BASOSABS 0.0 02/24/2019 0913    CMP     Component Value Date/Time   NA 140 06/03/2023 2016   NA 142 02/24/2019 0913   K 3.5  06/03/2023 2016   CL 104 06/03/2023 2016   CO2 21 (L) 06/03/2023 2009   GLUCOSE 92 06/03/2023 2016   BUN 11 06/03/2023 2016   BUN 12 02/24/2019 0913   CREATININE 0.90 06/03/2023 2016   CALCIUM 9.4 06/03/2023 2009   PROT 8.0 06/03/2023 2009   PROT 7.1 02/24/2019 0913   ALBUMIN 4.2 06/03/2023 2009   ALBUMIN 4.0 02/24/2019 0913   AST 28 06/03/2023 2009   ALT 26 06/03/2023 2009   ALKPHOS 62 06/03/2023 2009   BILITOT 0.6 06/03/2023 2009   BILITOT 0.2 02/24/2019 0913   GFRNONAA >60 06/03/2023 2009   GFRAA 87 02/24/2019 0913    Lipid Panel  Component Value Date/Time   TRIG 250 (H) 02/10/2019 0222    No results found for: "HGBA1C"    Imaging I have reviewed the images obtained:  CT-head-no acute changes. CT angio head and neck and CT perfusion study unremarkable. Stat MRI brain with no evidence of acute stroke.  Assessment: 52 year old woman past history of anxiety, depression, hypertension, prior stroke, history of seizures, migraines, DVT on anticoagulation presented for evaluation of altered mental status and generalized weakness.  Nonfocal and somewhat effort dependent weakness exam noted as above. Low suspicion for stroke but given prior history and sudden change in her clinical exam per the EMTs, rushed for a stat MRI which was negative for acute stroke. Later on, symptoms started to improve.  She was having conversations in full sentences.  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 It seems like this is either a complex migraine or some sort of stress response. I do not think she had a seizure. Has a fairly consistent outpatient neurology follow-up at [facility] Would recommend migraine treatment and if symptoms improve, discharged home with outpatient follow-up  Recommendations: IV fluids, migraine cocktail Cancel code stroke-every 2 hours neurochecks. Continue to monitor closely If symptoms improve, could be discharged with outpatient neurology follow-up. If symptoms do  not improve, consider admitting for observation, EEG and repeat neurological exam. Plan was discussed with Dr. Jeraldine Loots  -- Milon Dikes, MD Neurologist [facility] Pager: [phone]

## 2023-06-03 NOTE — ED Provider Notes (Cosign Needed Addendum)
[place] EMERGENCY DEPARTMENT AT [facility] Provider Note   CSN: [id number] Arrival date & time: 06/03/23  2007  An emergency department physician performed an initial assessment on this suspected stroke patient at 2008.  History  Chief Complaint  Patient presents with   Code Stroke   HPI Kirsten Huang is a 52 y.o. female with history of seizures, hypertension, borderline diabetes, on Xarelto for possible DVT presenting for code stroke.  Patient states that she has had a headache for couple days.  She showed up to work and "there is a lot going on".  Patient has a Therapist, occupational at the prison.  Her headache started to worsen and then she started to feel some numbness in her arm.  She cannot remember which 1.  Also reports that she was having difficulty with her thoughts and trouble with her words.  States she wanted to call her daughter but could not physically do so.  Reports weakness all over her body.  States she does take Keppra daily and has been compliant with her medication.  Denies recent alcohol use.  Denies fever or any other symptoms.  HPI     Home Medications Prior to Admission medications   Medication Sig Start Date End Date Taking? Authorizing Provider  albuterol (VENTOLIN HFA) 108 (90 Base) MCG/ACT inhaler Inhale 2 puffs into the lungs every 6 (six) hours as needed for shortness of breath or wheezing. 03/21/18  Yes [provider]  Aspirin-Salicylamide-Caffeine (BC HEADACHE PO) Take 1 Package by mouth daily as needed (pain).   Yes [provider]  atorvastatin (LIPITOR) 10 MG tablet Take 10 mg by mouth daily. 10/23/17  Yes [provider]  budesonide-formoterol (SYMBICORT) 80-4.5 MCG/ACT inhaler Inhale 2 puffs into the lungs 2 (two) times daily as needed (shortness of breath). 10/05/20  Yes [provider]  cetirizine (ZYRTEC) 10 MG tablet Take 10 mg by mouth daily as needed for allergies.   Yes [provider]   FEROSUL 325 (65 Fe) MG tablet Take 325 mg by mouth 2 (two) times a day. 09/11/18  Yes [provider]  fluticasone (FLONASE) 50 MCG/ACT nasal spray Place 2 sprays into both nostrils daily. 01/10/19  Yes [provider]  hydrochlorothiazide (MICROZIDE) 12.5 MG capsule Take 12.5 mg by mouth daily.  12/01/15  Yes [provider]  levETIRAcetam (KEPPRA XR) 500 MG 24 hr tablet Take 4 tablets (2,000 mg total) by mouth daily. Patient taking differently: Take 1,000 mg by mouth in the morning and at bedtime. 03/01/19  Yes Lomax, Amy, NP  norethindrone (AYGESTIN) 5 MG tablet Take 5 mg by mouth daily. 04/30/22  Yes [provider]  rosuvastatin (CRESTOR) 20 MG tablet Take 20 mg by mouth at bedtime. 10/26/20  Yes [provider]  sertraline (ZOLOFT) 100 MG tablet Take 200 mg by mouth daily.  01/30/16  Yes [provider]  zonisamide (ZONEGRAN) 100 MG capsule Take 100 mg by mouth 2 (two) times daily.   Yes [provider]  aspirin 325 MG EC tablet Take 325 mg by mouth daily.    [provider]  pantoprazole (PROTONIX) 40 MG tablet Take 40 mg by mouth 2 (two) times a day. 01/19/19   [provider]  SAXENDA 18 MG/3ML SOPN Inject 0.3 mLs into the skin daily.  10/02/17   [provider]      Allergies    Gadolinium derivatives    Review of Systems   See HPI for pertinent positive  Physical Exam   Vitals:   06/03/23 2245 06/03/23 2300  BP: 123/71 (!) 147/66  Pulse: (!) 58 (!) 57  Resp: 15 20  Temp:    SpO2: 99% 100%    CONSTITUTIONAL:  well-appearing, NAD NEURO:  GCS 15. Speech is goal oriented. No deficits appreciated to CN III-XII; symmetric eyebrow raise, no facial drooping, tongue midline. Patient has equal grip strength bilaterally with 4/5 strength against resistance in all major muscle groups bilaterally. Sensation to light touch intact. Patient moves extremities without ataxia. Normal finger-nose-finger. No  pronator drift. Did not assess gait.  EYES:  eyes equal and reactive ENT/NECK:  Supple, no stridor  CARDIO: Regular rate and rhythm, appears well-perfused PULM:  No respiratory distress, CTAB GI/GU:  non-distended, soft MSK/SPINE:  No gross deformities, no edema, moves all extremities  SKIN:  no rash, atraumatic  *Additional and/or pertinent findings included in MDM below   ED Results / Procedures / Treatments   Labs (all labs ordered are listed, but only abnormal results are displayed) Labs Reviewed  CBC - Abnormal; Notable for the following components:      Result Value   RBC 5.29 (*)    All other components within normal limits  COMPREHENSIVE METABOLIC PANEL - Abnormal; Notable for the following components:   CO2 21 (*)    All other components within normal limits  URINALYSIS, ROUTINE W REFLEX MICROSCOPIC - Abnormal; Notable for the following components:   APPearance HAZY (*)    Hgb urine dipstick LARGE (*)    Bacteria, UA RARE (*)    All other components within normal limits  POCT I-STAT, CHEM 8 - Abnormal; Notable for the following components:   Calcium, Ion 1.14 (*)    TCO2 21 (*)    Hemoglobin 15.3 (*)    All other components within normal limits  ETHANOL  PROTIME-INR  APTT  DIFFERENTIAL  RAPID URINE DRUG SCREEN, HOSP PERFORMED  HCG, SERUM, QUALITATIVE  I-STAT CHEM 8, ED  CBG MONITORING, ED    EKG None  Radiology MR BRAIN WO CONTRAST  Result Date: 06/03/2023 CLINICAL DATA:  Acute neurologic deficit EXAM: MRI HEAD WITHOUT CONTRAST TECHNIQUE: Multiplanar, multiecho pulse sequences of the brain and surrounding structures were obtained without intravenous contrast. COMPARISON:  None Available. FINDINGS: A tailored stroke protocol was utilized consisting of diffusion-weighted imaging, susceptibility weighted imaging and axial FLAIR sequence. There is no acute infarct. No hemorrhage. Minimal multifocal hyperintense T2-weighted signal within the supratentorial white  matter. IMPRESSION: No acute intracranial abnormality. Electronically Signed   By: Deatra Kahle Mcqueen M.D.   On: 06/03/2023 22:11   CT ANGIO HEAD NECK W WO CM W PERF (CODE STROKE)  Result Date: 06/03/2023 CLINICAL DATA:  Code stroke EXAM: CT ANGIOGRAPHY HEAD AND NECK CT PERFUSION BRAIN TECHNIQUE: Multidetector CT imaging of the head and neck was performed using the standard protocol during bolus administration of intravenous contrast. Multiplanar CT image reconstructions and MIPs were obtained to evaluate the vascular anatomy. Carotid stenosis measurements (when applicable) are obtained utilizing NASCET criteria, using the distal internal carotid diameter as the denominator. Multiphase CT imaging of the brain was performed following IV bolus contrast injection. Subsequent parametric perfusion maps were calculated using RAPID software. RADIATION DOSE REDUCTION: This exam was performed according to the departmental dose-optimization program which includes automated exposure control, adjustment of the mA and/or kV according to patient size and/or use of iterative reconstruction technique. CONTRAST:  OMNIPAQUE IOHEXOL 350 MG/ML SOLN COMPARISON:  None Available. FINDINGS: CTA NECK FINDINGS  Aortic arch: The imaged aortic arch is normal. The origins of the major branch vessels are patent. The subclavian arteries are patent to the level imaged. Right carotid system: The right common, internal, and external carotid arteries are patent, without stenosis or occlusion. There is no evidence of dissection or aneurysm. Left carotid system: The left common, internal, and external carotid arteries are patent, without stenosis or occlusion. There is no evidence of dissection or aneurysm. Vertebral arteries: The vertebral arteries are patent, without stenosis or occlusion. There is no evidence of dissection or aneurysm. Skeleton: There is no acute osseous abnormality or suspicious osseous lesion. There is no visible canal  hematoma. Other neck: There is a 1.6 cm right thyroid nodule. The soft tissues of the neck are otherwise unremarkable. Upper chest: The imaged lung apices are clear. Review of the MIP images confirms the above findings CTA HEAD FINDINGS Anterior circulation: The intracranial ICAs are patent, without stenosis or occlusion. The bilateral MCAs MCAs and ACAS are patent, without proximal stenosis or occlusion. The anterior communicating artery is normal. There is no aneurysm or AVM. Posterior circulation: The bilateral V4 segments are diminutive but patent. The major cerebellar arteries appear patent. The basilar artery is also diminutive but patent. The bilateral PCAs are patent, both supplied by posterior communicating arteries (fetal origins). There is no proximal stenosis or occlusion. There is no aneurysm or AVM. Venous sinuses: Patent. Anatomic variants: As above. Review of the MIP images confirms the above findings CT Brain Perfusion Findings: ASPECTS: 10 CBF (<30%) Volume: 0mL Perfusion (Tmax>6.0s) volume: 0mL Mismatch Volume: 0mL Infarction Location:n/a IMPRESSION: 1. Normal vasculature of the head and neck. 2. Negative CT perfusion. 3. Right thyroid nodule for which nonemergent thyroid ultrasound is recommended. Findings communicated to Dr Wilford Corner at 8:37 pm. Electronically Signed   By: Lesia Hausen M.D.   On: 06/03/2023 20:37   CT HEAD CODE STROKE WO CONTRAST  Result Date: 06/03/2023 CLINICAL DATA:  Code stroke.  Acute neurologic deficit EXAM: CT HEAD WITHOUT CONTRAST TECHNIQUE: Contiguous axial images were obtained from the base of the skull through the vertex without intravenous contrast. RADIATION DOSE REDUCTION: This exam was performed according to the departmental dose-optimization program which includes automated exposure control, adjustment of the mA and/or kV according to patient size and/or use of iterative reconstruction technique. COMPARISON:  None Available. FINDINGS: Brain: There is no mass,  hemorrhage or extra-axial collection. The size and configuration of the ventricles and extra-axial CSF spaces are normal. The brain parenchyma is normal, without evidence of acute or chronic infarction. Partially empty sella turcica. Vascular: No abnormal hyperdensity of the major intracranial arteries or dural venous sinuses. No intracranial atherosclerosis. Skull: The visualized skull base, calvarium and extracranial soft tissues are normal. Sinuses/Orbits: No fluid levels or advanced mucosal thickening of the visualized paranasal sinuses. No mastoid or middle ear effusion. The orbits are normal. ASPECTS [facility] Stroke Program Early CT Score) - Ganglionic level infarction (caudate, lentiform nuclei, internal capsule, insula, M1-M3 cortex): 7 - Supraganglionic infarction (M4-M6 cortex): 3 Total score (0-10 with 10 being normal): 10 IMPRESSION: 1. No acute intracranial abnormality. 2. ASPECTS is 10. These results were called by telephone at the time of interpretation on 06/03/2023 at 8:22 pm to provider Gerhard Munch , who verbally acknowledged these results. Electronically Signed   By: Deatra Jaculin Rasmus M.D.   On: 06/03/2023 20:22    Procedures Procedures    Medications Ordered in ED Medications  iohexol (OMNIPAQUE) 350 MG/ML injection 75 mL (has no administration  in time range)  sodium chloride 0.9 % bolus 1,000 mL (0 mLs Intravenous Stopped 06/03/23 2320)    And  0.9 %  sodium chloride infusion ( Intravenous New Bag/Given 06/03/23 2142)  iohexol (OMNIPAQUE) 350 MG/ML injection 100 mL (100 mLs Intravenous Contrast Given 06/03/23 2022)  ketorolac (TORADOL) 15 MG/ML injection 15 mg (15 mg Intravenous Given 06/03/23 2143)  metoCLOPramide (REGLAN) injection 10 mg (10 mg Intravenous Given 06/03/23 2142)  dexamethasone (DECADRON) injection 10 mg (10 mg Intravenous Given 06/03/23 2143)  LORazepam (ATIVAN) injection 1 mg (1 mg Intravenous Given 06/03/23 2152)    ED Course/ Medical Decision Making/ A&P Clinical  Course as of 06/04/23 0000  Tue Jun 03, 2023  2025 CT ANGIO HEAD NECK W WO CM W PERF (CODE STROKE) [JR]    Clinical Course User Index [JR] Gareth Eagle, PA-C                                 Medical Decision Making Amount and/or Complexity of Data Reviewed Labs: ordered. Radiology: ordered. Decision-making details documented in ED Course.  Risk Prescription drug management.   Initial Impression and Ddx 52 year old well-appearing female presenting for stroke evaluation.  Exam revealed generalized weakness in her extremities but otherwise reassuring.  DDx includes stroke, electrolyte derangement, seizures, arrhythmia, other Patient PMH that increases complexity of ED encounter:  history of seizures, hypertension, borderline diabetes, on Xarelto for possible DVT  Interpretation of Diagnostics - I independent reviewed and interpreted the labs as followed: hematuria  - I independently visualized the following imaging with scope of interpretation limited to determining acute life threatening conditions related to emergency care: MRI brain, CT angio head and neck and CT head without acute findings.  Right thyroid nodule was discovered on CT angio of the neck.  -I personally reviewed and interpreted EKG which revealed sinus rhythm  Patient Reassessment and Ultimate Disposition/Management Overall patient remains well with improved headache after headache cocktail.  All scans without acute findings.  Evaluated by Dr. Wilford Corner of neurology who advised that if she is symptomatically improved and is appropriate to discharge with neurology at [facility] where she is normally followed.  On reassessment patient continued to improve.  Vitals remained stable throughout encounter.  Discussed return precautions.  Discharged.  Patient management required discussion with the following services or consulting groups:  Neurology  Complexity of Problems Addressed Acute complicated illness or Injury  Additional  Data Reviewed and Analyzed Further history obtained from: Further history from spouse/family member, Past medical history and medications listed in the EMR, and Prior ED visit notes  Patient Encounter Risk Assessment None    Final Clinical Impression(s) / ED Diagnoses Final diagnoses:  Nonintractable headache, unspecified chronicity pattern, unspecified headache type    Rx / DC Orders ED Discharge Orders     None         Gareth Eagle, PA-C 06/04/23 0000    Gerhard Munch, MD 06/04/23 1720

## 2023-06-03 NOTE — ED Notes (Signed)
Dr. Wilford Corner at the patient's bedside

## 2023-06-03 NOTE — Discharge Instructions (Signed)
Evaluation today was overall reassuring.  Your CT scans and MRI were negative for any acute injury that could be causing her symptoms today.  Symptoms could all be related to a migraine.  Recommend supportive treatment at home and follow-up with your neurologist at [facility].  If your symptoms return or you have any other concerning symptom please return emerged for further evaluation.

## 2023-06-03 NOTE — ED Notes (Signed)
Pt able to ambulate in the hallway to the bathroom with no assistance. Pt did not report any pain, weakness, or dizziness.

## 2023-06-03 NOTE — ED Notes (Signed)
Patient transported to MRI 

## 2023-06-03 NOTE — ED Notes (Signed)
Patient placed in room 29 at this time.

## 2023-06-03 NOTE — ED Triage Notes (Signed)
Patient BIB EMS from work as Code Stroke.  Per EMS patient had complaint of headache x couple days, found by co workers.  Patient reports numbness in arm and headache. Patient unable to recall all events that occurred.   EMS reports on arrival patient presented with global weakness, word salad.

## 2023-06-03 NOTE — ED Notes (Signed)
Assumed care of the patient from [place], [place]

## 2023-06-04 NOTE — ED Notes (Signed)
Per Dr. Wilford Corner the patient is every 4 hours Neuro checks
